# Patient Record
Sex: Male | Born: 1950
Health system: Southern US, Community
[De-identification: ages and names within clinical notes are randomized; demographics above are authoritative.]

## PROBLEM LIST (undated history)

## (undated) DIAGNOSIS — G4733 Obstructive sleep apnea (adult) (pediatric): Secondary | ICD-10-CM

## (undated) DIAGNOSIS — Z77090 Contact with and (suspected) exposure to asbestos: Secondary | ICD-10-CM

## (undated) DIAGNOSIS — G4730 Sleep apnea, unspecified: Secondary | ICD-10-CM

## (undated) DIAGNOSIS — H269 Unspecified cataract: Secondary | ICD-10-CM

## (undated) DIAGNOSIS — T7840XA Allergy, unspecified, initial encounter: Secondary | ICD-10-CM

## (undated) DIAGNOSIS — E785 Hyperlipidemia, unspecified: Secondary | ICD-10-CM

## (undated) DIAGNOSIS — H4010X0 Unspecified open-angle glaucoma, stage unspecified: Secondary | ICD-10-CM

## (undated) DIAGNOSIS — M549 Dorsalgia, unspecified: Secondary | ICD-10-CM

## (undated) DIAGNOSIS — L409 Psoriasis, unspecified: Secondary | ICD-10-CM

## (undated) DIAGNOSIS — E119 Type 2 diabetes mellitus without complications: Secondary | ICD-10-CM

## (undated) DIAGNOSIS — J324 Chronic pansinusitis: Secondary | ICD-10-CM

## (undated) HISTORY — DX: Dorsalgia, unspecified: M54.9

## (undated) HISTORY — DX: Contact with and (suspected) exposure to asbestos: Z77.090

## (undated) HISTORY — DX: Chronic pansinusitis: J32.4

## (undated) HISTORY — PX: FINGER SURGERY: SHX640

## (undated) HISTORY — DX: Obstructive sleep apnea (adult) (pediatric): G47.33

## (undated) HISTORY — DX: Type 2 diabetes mellitus without complications: E11.9

## (undated) HISTORY — DX: Sleep apnea, unspecified: G47.30

## (undated) HISTORY — DX: Allergy, unspecified, initial encounter: T78.40XA

## (undated) HISTORY — DX: Unspecified open-angle glaucoma, stage unspecified: H40.10X0

## (undated) HISTORY — DX: Hyperlipidemia, unspecified: E78.5

## (undated) HISTORY — DX: Unspecified cataract: H26.9

## (undated) HISTORY — DX: Psoriasis, unspecified: L40.9

---

## 1996-09-30 HISTORY — PX: NASAL SINUS SURGERY: SHX719

## 2002-07-14 ENCOUNTER — Encounter: Payer: Self-pay | Admitting: Emergency Medicine

## 2002-07-14 ENCOUNTER — Encounter: Admission: RE | Admit: 2002-07-14 | Discharge: 2002-07-14 | Payer: Self-pay | Admitting: Emergency Medicine

## 2002-07-29 ENCOUNTER — Encounter: Admission: RE | Admit: 2002-07-29 | Discharge: 2002-07-29 | Payer: Self-pay | Admitting: Emergency Medicine

## 2002-07-29 ENCOUNTER — Encounter: Payer: Self-pay | Admitting: Emergency Medicine

## 2003-10-01 HISTORY — PX: COLONOSCOPY: SHX174

## 2004-05-29 ENCOUNTER — Emergency Department (HOSPITAL_COMMUNITY): Admission: EM | Admit: 2004-05-29 | Discharge: 2004-05-30 | Payer: Self-pay | Admitting: Emergency Medicine

## 2006-01-23 ENCOUNTER — Encounter: Admission: RE | Admit: 2006-01-23 | Discharge: 2006-01-23 | Payer: Self-pay | Admitting: Emergency Medicine

## 2007-03-05 ENCOUNTER — Ambulatory Visit (HOSPITAL_COMMUNITY): Admission: RE | Admit: 2007-03-05 | Discharge: 2007-03-05 | Payer: Self-pay | Admitting: Emergency Medicine

## 2007-06-24 ENCOUNTER — Encounter: Admission: RE | Admit: 2007-06-24 | Discharge: 2007-06-24 | Payer: Self-pay | Admitting: Emergency Medicine

## 2008-05-04 ENCOUNTER — Encounter: Admission: RE | Admit: 2008-05-04 | Discharge: 2008-05-04 | Payer: Self-pay | Admitting: Emergency Medicine

## 2008-09-25 IMAGING — CT CT ABDOMEN W/ CM
2 of 5 series · 17 of 46 positions shown, 19 images · IV contrast (READICAT/WATER & [ID] OMNI 300)
Comparison: 01/23/2006

CT ABDOMEN

CLINICAL DATA: Right lower quadrant pain

CT ABDOMEN AND PELVIS WITH CONTRAST
TECHNIQUE: Multidetector CT imaging of the abdomen and pelvis was
performed using the standard protocol following bolus
administration of intravenous contrast.
Contrast: 125 ml [id number]

[Series 2: a&p w/ · axial · 0.78mm/px · z∈[-512,-112]mm · 14 of 92 slices shown, 16 images]
[im 6/92  soft-tissue]
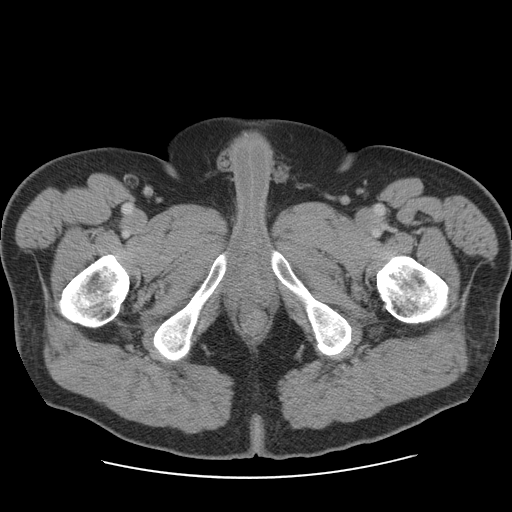
[im 6/92  bone]
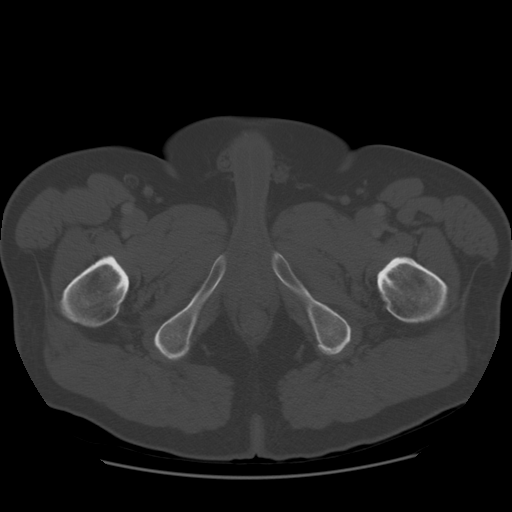
[im 11/92  soft-tissue]
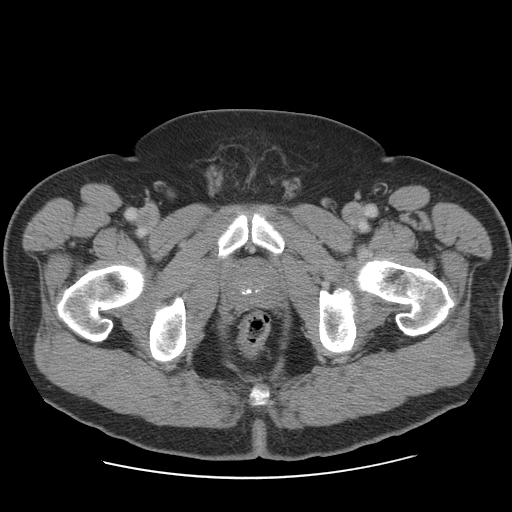
[im 21/92  soft-tissue]
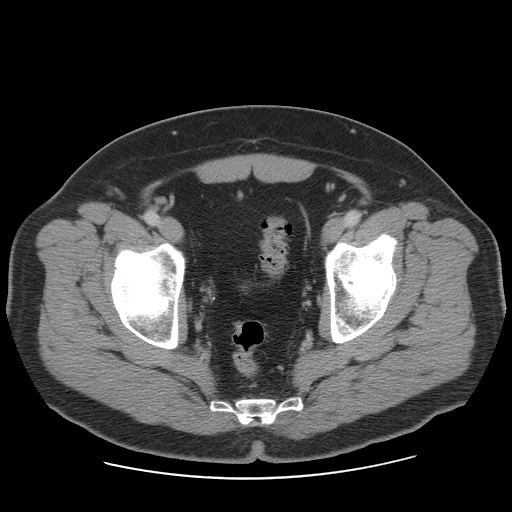
[im 26/92  soft-tissue]
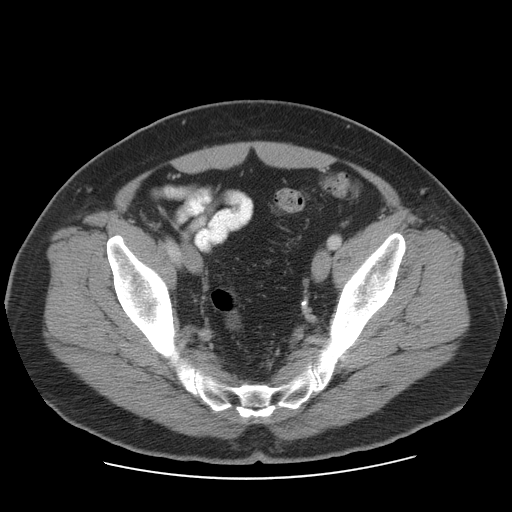
[im 31/92  soft-tissue]
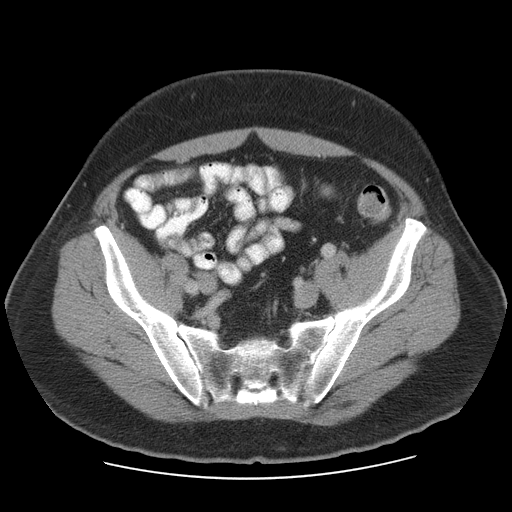
[im 36/92  soft-tissue]
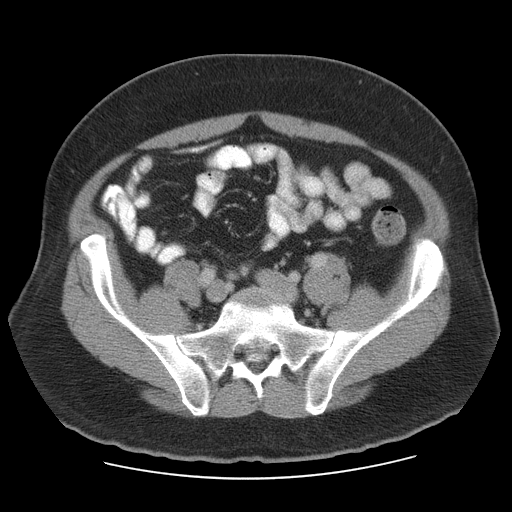
[im 41/92  soft-tissue]
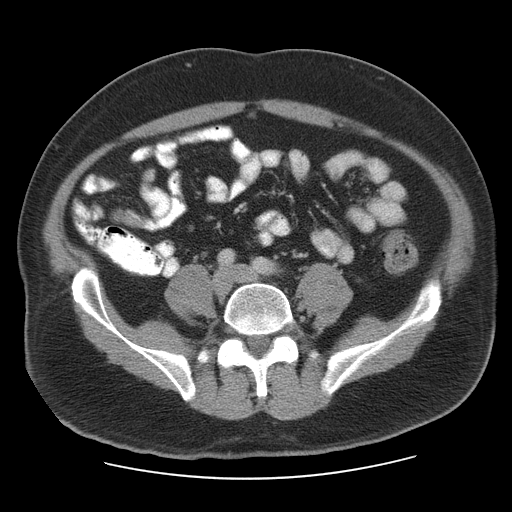
[im 51/92  soft-tissue]
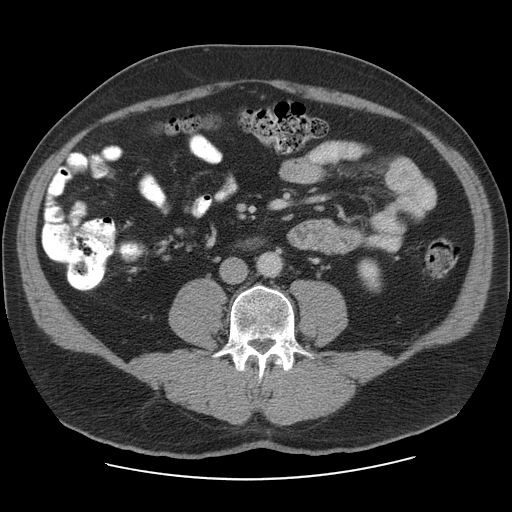
[im 56/92  soft-tissue]
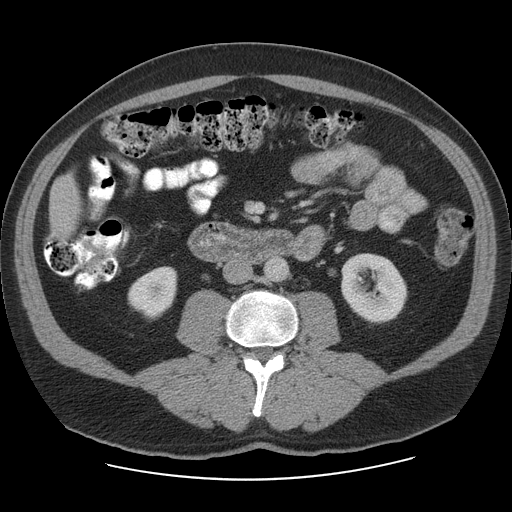
[im 56/92  bone]
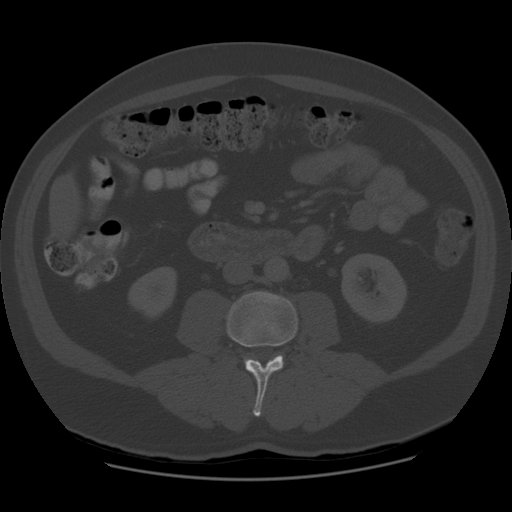
[im 61/92  soft-tissue]
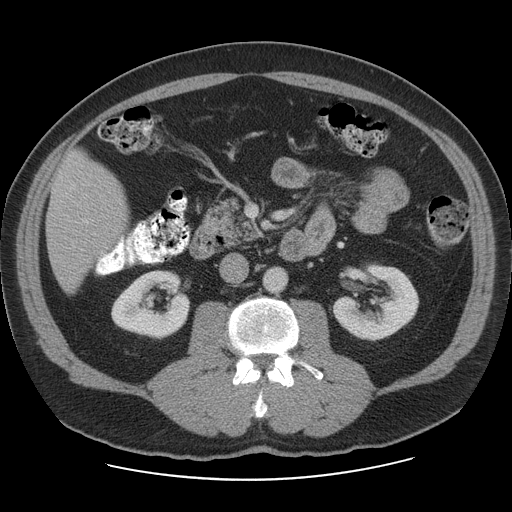
[im 66/92  soft-tissue]
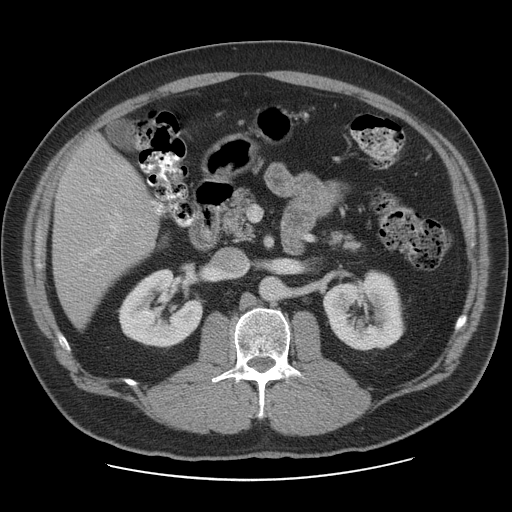
[im 71/92  soft-tissue]
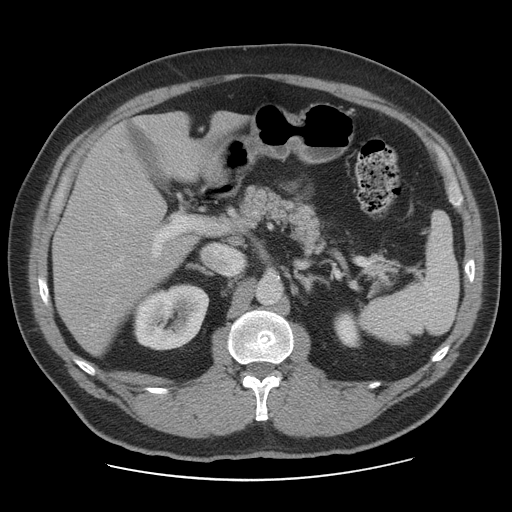
[im 81/92  soft-tissue]
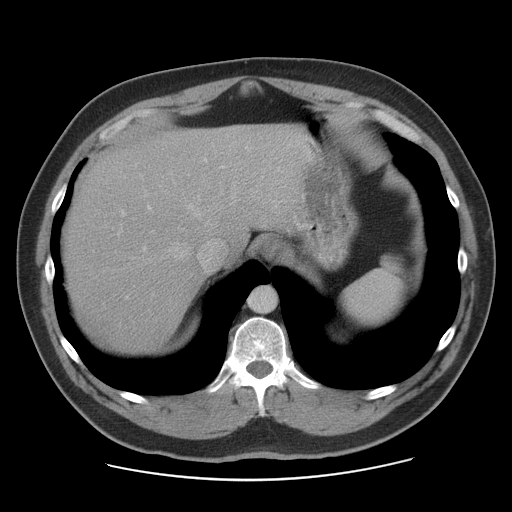
[im 86/92  soft-tissue]
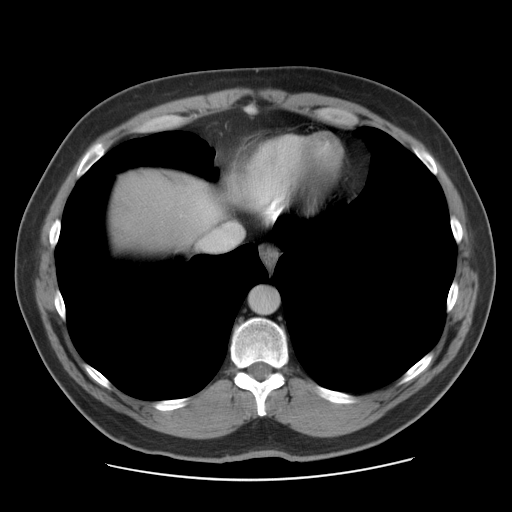

[Series 400: cor abd · coronal · 0.92mm/px · 3 of 152 slices shown]
[im 51/152  soft-tissue]
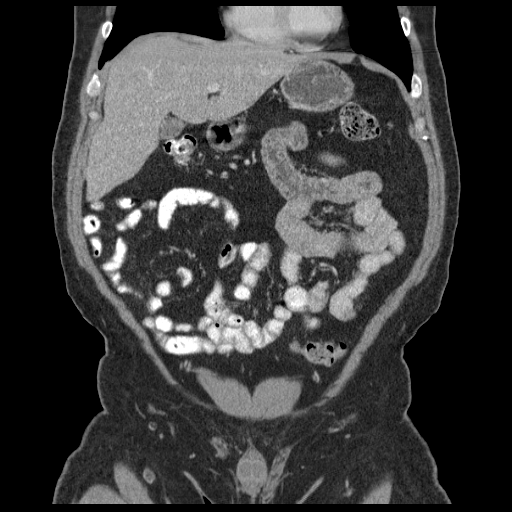
[im 68/152  soft-tissue]
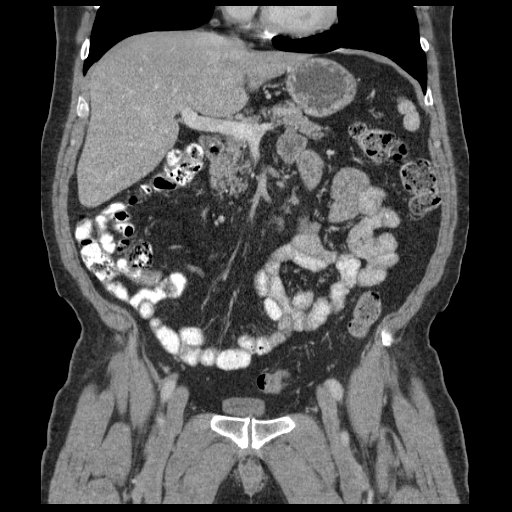
[im 84/152  soft-tissue]
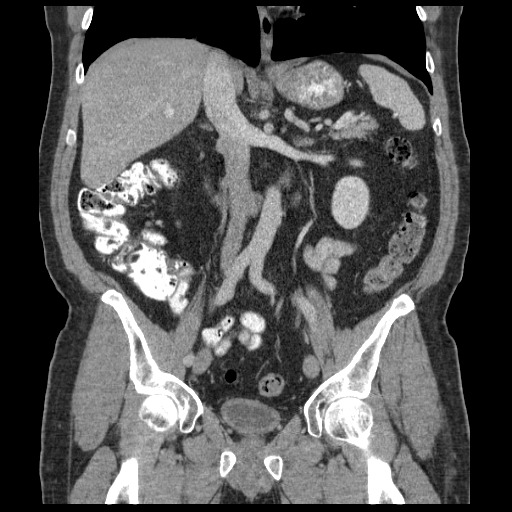

[17 of 46 positions shown; findings below may reference images not displayed]

FINDINGS: Tiny hypodensity in the right liver, adjacent to the
gallbladder fossa is too small to characterize but likely
represents a tiny cyst.  The liver is otherwise unremarkable.  The
spleen, stomach, pancreas, gallbladder, and adrenal glands have
normal imaging features.  Small duodenal diverticulum noted.  No
focal abnormality is seen in either kidney.

No abdominal lymphadenopathy.  No abdominal aortic aneurysm.  There
is no intraperitoneal free fluid.
IMPRESSION: Unremarkable CT scan of the abdomen.

CT PELVIS
FINDINGS: There is no intraperitoneal free fluid.  No pelvic
sidewall lymphadenopathy.  Apparent mild circumferential wall
thickening in the bladder is probably related to underdistension.
Prostate gland is minimally enlarged.  There is no substantial
diverticular change in the colon.  No diverticulitis. Small bowel
loops are normal. The terminal ileum and the appendix are normal.

No evidence for inguinal hernia.  There is no obturator hernia.

Bone windows show no suspicious lytic or sclerotic osseous lesions.
IMPRESSION: Unremarkable CT exam of the pelvis.  Specifically, there is no CT
evidence to explain this patient's history of right lower quadrant
pain.

## 2009-06-21 ENCOUNTER — Encounter: Payer: Self-pay | Admitting: Pulmonary Disease

## 2009-06-21 ENCOUNTER — Ambulatory Visit (HOSPITAL_BASED_OUTPATIENT_CLINIC_OR_DEPARTMENT_OTHER): Admission: RE | Admit: 2009-06-21 | Discharge: 2009-06-21 | Payer: Self-pay | Admitting: Emergency Medicine

## 2009-06-24 ENCOUNTER — Ambulatory Visit: Payer: Self-pay | Admitting: Internal Medicine

## 2009-07-05 ENCOUNTER — Ambulatory Visit: Payer: Self-pay | Admitting: Pulmonary Disease

## 2009-07-05 DIAGNOSIS — J309 Allergic rhinitis, unspecified: Secondary | ICD-10-CM | POA: Insufficient documentation

## 2009-07-05 DIAGNOSIS — E785 Hyperlipidemia, unspecified: Secondary | ICD-10-CM

## 2009-07-05 DIAGNOSIS — G4733 Obstructive sleep apnea (adult) (pediatric): Secondary | ICD-10-CM

## 2009-07-06 ENCOUNTER — Encounter: Payer: Self-pay | Admitting: Pulmonary Disease

## 2009-07-06 ENCOUNTER — Ambulatory Visit (HOSPITAL_BASED_OUTPATIENT_CLINIC_OR_DEPARTMENT_OTHER): Admission: RE | Admit: 2009-07-06 | Discharge: 2009-07-06 | Payer: Self-pay | Admitting: Pulmonary Disease

## 2009-07-18 ENCOUNTER — Ambulatory Visit: Payer: Self-pay | Admitting: Pulmonary Disease

## 2009-08-03 ENCOUNTER — Encounter: Payer: Self-pay | Admitting: Pulmonary Disease

## 2010-05-11 ENCOUNTER — Ambulatory Visit: Payer: Self-pay | Admitting: Pulmonary Disease

## 2010-06-15 ENCOUNTER — Ambulatory Visit: Payer: Self-pay | Admitting: Pulmonary Disease

## 2010-08-16 ENCOUNTER — Encounter: Admission: RE | Admit: 2010-08-16 | Discharge: 2010-08-16 | Payer: Self-pay | Admitting: Family Medicine

## 2010-10-30 NOTE — Assessment & Plan Note (Signed)
Summary: rov for osa   Copy to:  [facility] Korri Ask/Referring Elajah Kunsman:  [facility]  CC:  5 week follow up. Pt states he uses cpap 7/7 nights x 6-8 hrs a night. Pt states his mask will occasionally have air seep through..  History of Present Illness: the pt comes in today for f/u of his known osa.  He has been started on cpap at optimal pressure, and feels he is doing very well.  He has seen significant improvement in the quality of his sleep, is not snoring, and has improved alertness during the day.  He is having no issues with pressure or mask, but occasionally will have air leak if he turns on his side.  Overall, he is very pleased with results.  Current Medications (verified): 1)  Aspirin Ec Low Dose 81 Mg Tbec (Aspirin) .... Take 1 Tablet By Mouth Once A Day 2)  Levocetirizine Dihydrochloride 5 Mg Tabs (Levocetirizine Dihydrochloride) .... Once Daily 3)  Alprazolam 0.25 Mg Tabs (Alprazolam) .... 1/2 Tab As Needed For Anxiety 4)  Hydrochlorothiazide 25 Mg Tabs (Hydrochlorothiazide) .... Once Daily 5)  Pravastatin Sodium (? Strength) .... Once Daily  Allergies (verified): No Known Drug Allergies  Review of Systems       The patient complains of nasal congestion/difficulty breathing through nose and anxiety.  The patient denies shortness of breath with activity, shortness of breath at rest, productive cough, non-productive cough, coughing up blood, chest pain, irregular heartbeats, acid heartburn, indigestion, loss of appetite, weight change, abdominal pain, difficulty swallowing, sore throat, tooth/dental problems, headaches, sneezing, itching, ear ache, depression, hand/feet swelling, rash, change in color of mucus, and fever.    Vital Signs:  Patient profile:   60 year old male Height:      76 inches Weight:      263 pounds BMI:     32.13 O2 Sat:      98 % on Room air Temp:     98.0 degrees F oral Pulse rate:   68 / minute BP sitting:   130 / 90  (left arm) Cuff  size:   large  Vitals Entered By: Carver Fila (June 15, 2010 9:31 AM)  O2 Flow:  Room air CC: 5 week follow up. Pt states he uses cpap 7/7 nights x 6-8 hrs a night. Pt states his mask will occasionally have air seep through. Comments meds and allergies updated Phone number updated Carver Fila  June 15, 2010 9:32 AM    Physical Exam  General:  ow male in nad Nose:  no skin breakdown or pressure necrosis from cpap mask Extremities:  no edema or cyanosis  Neurologic:  alert and oriented,moves all 4. does not appear sleepy   Impression & Recommendations:  Problem # 1:  OBSTRUCTIVE SLEEP APNEA (ICD-327.23)  the pt is doing well with cpap on optimal pressure.  He has no tolerance issues, but occasionally will have small air leak on mask.  This is not a big issue for him.  I have asked him to stay on cpap, and to work hard on weight loss.  I have reminded him that he can probably come off cpap if he is able to get back to his ideal body weight.  Will see him again in 6mos.  Medications Added to Medication List This Visit: 1)  Levocetirizine Dihydrochloride 5 Mg Tabs (Levocetirizine dihydrochloride) .... Once daily 2)  Alprazolam 0.25 Mg Tabs (Alprazolam) .... 1/2 tab as needed for anxiety 3)  Hydrochlorothiazide 25 Mg  Tabs (Hydrochlorothiazide) .... Once daily 4)  Pravastatin Sodium (? Strength)  .... Once daily  Other Orders: Est. Patient Level III (81191)  Patient Instructions: 1)  continue on cpap , work on weight loss 2)  followup with me in 6mos.   Immunization History:  Influenza Immunization History:    Influenza:  historical (06/30/2009)

## 2010-10-30 NOTE — Assessment & Plan Note (Signed)
Summary: rov for management of osa   Copy to:  [facility] Provider/Referring Provider:  [facility]  CC:  Pt is here for a f/u appt to discuss sleep study results. .  History of Present Illness: The pt is a 60y/o male who I have seen in the past for severe osa.  He had a sleep study last year which documented severe disease, however had issues with cpap titration due to pressure induced centrals.  He underwent a formal cpap titration, and felt to have the best control with a cpap pressure of 9cm.  Attempts were made to contact the pt for a f/u apptm, and a certified letter was sent to his home as well.  He has been lost to f/u, but returns today to discuss starting cpap.  He continues to have nonrestorative sleep,and EDS.  Medications Prior to Update: 1)  None  Allergies (verified): No Known Drug Allergies  Past History:  Past medical, surgical, family and social histories (including risk factors) reviewed, and no changes noted (except as noted below).  Past Medical History: Reviewed history from 07/05/2009 and no changes required. OSA HYPERLIPIDEMIA (ICD-272.4) ALLERGIC RHINITIS (ICD-477.9)    Past Surgical History: Reviewed history from 07/05/2009 and no changes required. sinus surery 1998  Family History: Reviewed history from 07/05/2009 and no changes required. allergies: father, sisters, brothers cancer: maternal uncle (colon) paternal uncles x 3 (prostate)   Social History: Reviewed history from 07/05/2009 and no changes required. pt is single. pt has children. pt will occ. smoke cigars. pt works as a Physiological scientist.    Review of Systems       The patient complains of shortness of breath with activity, productive cough, acid heartburn, indigestion, headaches, and nasal congestion/difficulty breathing through nose.  The patient denies shortness of breath at rest, non-productive cough, coughing up blood, chest pain, irregular heartbeats, loss of  appetite, weight change, abdominal pain, difficulty swallowing, sore throat, tooth/dental problems, sneezing, itching, ear ache, anxiety, depression, hand/feet swelling, joint stiffness or pain, rash, change in color of mucus, and fever.    Vital Signs:  Patient profile:   60 year old male Height:      76 inches Weight:      261.50 pounds BMI:     31.95 O2 Sat:      97 % on Room air Temp:     98.6 degrees F oral Pulse rate:   83 / minute BP sitting:   132 / 74  (left arm) Cuff size:   large  Vitals Entered By: Arman Filter LPN (May 11, 2010 2:50 PM)  O2 Flow:  Room air CC: Pt is here for a f/u appt to discuss sleep study results.  Comments Unable to review meds with pt as he is unsure what meds he is currently taking and does not have a current med list with him.  Aundra Millet Reynolds LPN  May 11, 2010 2:50 PM    Physical Exam  General:  ow male in nad Nose:  no obvious drainage or purulence Extremities:  no edema noted, no cyanosis  Neurologic:  alert and oriented, moves all 4.   Impression & Recommendations:  Problem # 1:  OBSTRUCTIVE SLEEP APNEA (ICD-327.23) the pt has a h/o severe osa that continues to be untreated.  Given the severity, his best treatment options are cpap while working on weight loss.  He is willing to give this a try, and will go ahead and start on 9cm.  He has  gained further weight since the last titration, and Schreck[place]ost need higher pressures.  If he has suboptimal response to the initial pressure setting, can try him on auto to optimize further.  He is concerned about possible issues with claustrophobia, and I have discussed various strategies with him regarding desensitization.  Other Orders: Est. Patient Level IV (16109) DME Referral (DME)  Patient Instructions: 1)  will start on cpap with full face mask initially.  Please call if having issues with tolerance 2)  can use ramp button to help with pressure tolerance, and can also wear for at a time  during waking hours to help with desensitization. 3)  work on weight loss 4)  followup with me in 5 weeks.  Appended Document: rov for management of osa received med list from [facility] and added these meds to pt's current med list.  Arman Filter LPN  May 14, 2010 4:37 PM    Clinical Lists Changes  Medications: Added new medication of VITAMIN B-12 1000 MCG TABS (CYANOCOBALAMIN) injection once a month Added new medication of HYDROXYZINE HCL 25 MG TABS (HYDROXYZINE HCL) Take 1 tablet by mouth once a day Added new medication of KLOR-CON 10 10 MEQ CR-TABS (POTASSIUM CHLORIDE) Take 1 tablet by mouth once a day Added new medication of LIPITOR 10 MG TABS (ATORVASTATIN CALCIUM) Take 1 tablet by mouth once a day Added new medication of LISINOPRIL 20 MG TABS (LISINOPRIL) Take 1 tablet by mouth once a day Added new medication of MECLIZINE HCL 25 MG TABS (MECLIZINE HCL) take by mouth as needed for dizziness Added new medication of METOPROLOL TARTRATE 50 MG TABS (METOPROLOL TARTRATE) Take 1 tablet by mouth two times a day Added new medication of MOBIC 15 MG TABS (MELOXICAM) Take 1 tablet by mouth once a day Added new medication of PLAVIX 75 MG TABS (CLOPIDOGREL BISULFATE) Take 1 tablet by mouth once a day Added new medication of PROTONIX 40 MG TBEC (PANTOPRAZOLE SODIUM) Take 1 tablet by mouth once a day Added new medication of ASPIRIN EC LOW DOSE 81 MG TBEC (ASPIRIN) Take 1 tablet by mouth once a day

## 2010-12-12 ENCOUNTER — Ambulatory Visit: Payer: Self-pay | Admitting: Pulmonary Disease

## 2010-12-13 ENCOUNTER — Telehealth: Payer: Self-pay | Admitting: Pulmonary Disease

## 2010-12-18 NOTE — Progress Notes (Signed)
Summary: nos appt  Phone Note Call from Patient   Caller: [email or web address]  Call For: Ashleigh Arya Summary of Call: Rsc nos from 3/14 to 3/28. Initial call taken by: Darletta Moll,  December 13, 2010 9:25 AM

## 2010-12-25 ENCOUNTER — Encounter: Payer: Self-pay | Admitting: Pulmonary Disease

## 2010-12-26 ENCOUNTER — Encounter: Payer: Self-pay | Admitting: Pulmonary Disease

## 2010-12-26 ENCOUNTER — Ambulatory Visit (INDEPENDENT_AMBULATORY_CARE_PROVIDER_SITE_OTHER): Payer: 59 | Admitting: Pulmonary Disease

## 2010-12-26 VITALS — BP 132/88 | HR 59 | Temp 98.2°F | Ht 76.0 in | Wt 259.4 lb

## 2010-12-26 DIAGNOSIS — G4733 Obstructive sleep apnea (adult) (pediatric): Secondary | ICD-10-CM

## 2010-12-26 NOTE — Patient Instructions (Signed)
Continue on cpap, keep up with mask changes and supplies. Work on weight loss followup with me in one year .  

## 2010-12-26 NOTE — Progress Notes (Signed)
  Subjective:    Patient ID: Gary Buckley, male    DOB: 1951-04-21, 60 y.o.   MRN: [id number]  HPI The pt comes in today for f/u of his known osa.  He is wearing cpap compliantly, and reports no issues with machine or mask.  He is sleeping well, and is satisfied with his daytime alertness.  He has lost 4 pounds since the last visit.    Review of Systems  Constitutional: Negative for fever, appetite change and unexpected weight change.  HENT: Negative for ear pain, congestion, sore throat, rhinorrhea, sneezing, trouble swallowing, dental problem and postnasal drip.   Eyes: Negative for redness.  Respiratory: Negative for cough, chest tightness, shortness of breath and wheezing.   Cardiovascular: Negative for chest pain, palpitations and leg swelling.  Gastrointestinal: Negative for nausea, abdominal pain and diarrhea.  Genitourinary: Negative for dysuria.  Musculoskeletal: Negative for joint swelling.  Skin: Negative for rash.  Neurological: Negative for syncope and headaches.  Hematological: Does not bruise/bleed easily.  Psychiatric/Behavioral: Negative for dysphoric mood. The patient is not nervous/anxious.        Objective:   Physical Exam Ow male in nad No skin breakdown or pressure necrosis from cpap mask  No LE edema Alert, oriented, moves all 4        Assessment & Plan:

## 2010-12-26 NOTE — Assessment & Plan Note (Signed)
The pt is doing well with cpap, and reports no mask or pressure issues.  He is sleeping better, with improved daytime alertness.  He has lost a small amt of weight since last visit, but I have encouraged him to continue doing so.

## 2011-07-17 ENCOUNTER — Ambulatory Visit
Admission: RE | Admit: 2011-07-17 | Discharge: 2011-07-17 | Disposition: A | Discharge status: Home / Self Care | Payer: 59 | Source: Ambulatory Visit | Attending: Family Medicine | Admitting: Family Medicine

## 2011-07-17 ENCOUNTER — Other Ambulatory Visit: Payer: Self-pay | Admitting: Family Medicine

## 2011-07-17 DIAGNOSIS — M549 Dorsalgia, unspecified: Secondary | ICD-10-CM

## 2011-07-17 DIAGNOSIS — M542 Cervicalgia: Secondary | ICD-10-CM

## 2011-10-21 ENCOUNTER — Other Ambulatory Visit: Payer: Self-pay | Admitting: Family Medicine

## 2011-10-21 DIAGNOSIS — R109 Unspecified abdominal pain: Secondary | ICD-10-CM

## 2011-10-23 ENCOUNTER — Other Ambulatory Visit: Payer: 59

## 2011-10-25 ENCOUNTER — Ambulatory Visit
Admission: RE | Admit: 2011-10-25 | Discharge: 2011-10-25 | Disposition: A | Discharge status: Home / Self Care | Payer: 59 | Source: Ambulatory Visit | Attending: Family Medicine | Admitting: Family Medicine

## 2011-10-25 DIAGNOSIS — R109 Unspecified abdominal pain: Secondary | ICD-10-CM

## 2012-10-21 ENCOUNTER — Other Ambulatory Visit (HOSPITAL_COMMUNITY): Payer: Self-pay | Admitting: Family Medicine

## 2012-10-21 DIAGNOSIS — R0602 Shortness of breath: Secondary | ICD-10-CM

## 2012-10-23 ENCOUNTER — Ambulatory Visit (HOSPITAL_COMMUNITY)
Admission: RE | Admit: 2012-10-23 | Discharge: 2012-10-23 | Disposition: A | Discharge status: Home / Self Care | Payer: 59 | Source: Ambulatory Visit | Attending: Family Medicine | Admitting: Family Medicine

## 2012-10-23 DIAGNOSIS — R0602 Shortness of breath: Secondary | ICD-10-CM | POA: Insufficient documentation

## 2012-10-23 MED ORDER — ALBUTEROL SULFATE (5 MG/ML) 0.5% IN NEBU
2.5000 mg | INHALATION_SOLUTION | Freq: Once | RESPIRATORY_TRACT | Status: AC
  Administered 2012-10-23: 2.5 mg via RESPIRATORY_TRACT

## 2012-10-27 ENCOUNTER — Encounter (HOSPITAL_COMMUNITY): Payer: 59

## 2014-03-28 ENCOUNTER — Encounter: Payer: Self-pay | Admitting: Internal Medicine

## 2014-06-07 ENCOUNTER — Ambulatory Visit (AMBULATORY_SURGERY_CENTER): Payer: Self-pay | Admitting: *Deleted

## 2014-06-07 VITALS — Ht 76.0 in | Wt 253.4 lb

## 2014-06-07 DIAGNOSIS — Z1211 Encounter for screening for malignant neoplasm of colon: Secondary | ICD-10-CM

## 2014-06-07 MED ORDER — NA SULFATE-K SULFATE-MG SULF 17.5-3.13-1.6 GM/177ML PO SOLN: 1.0000 | Freq: Once | ORAL | Status: DC

## 2014-06-07 NOTE — Progress Notes (Signed)
No egg or soy allergy. ewm No home 02 use but uses cpap every night. ewm No problems with past sedation. ewm

## 2014-06-21 ENCOUNTER — Ambulatory Visit (AMBULATORY_SURGERY_CENTER): Payer: 59 | Admitting: Internal Medicine

## 2014-06-21 ENCOUNTER — Encounter: Payer: Self-pay | Admitting: Internal Medicine

## 2014-06-21 VITALS — BP 141/100 | HR 48 | Temp 97.1°F | Resp 27 | Ht 76.0 in | Wt 253.0 lb

## 2014-06-21 DIAGNOSIS — K5730 Diverticulosis of large intestine without perforation or abscess without bleeding: Secondary | ICD-10-CM

## 2014-06-21 DIAGNOSIS — K648 Other hemorrhoids: Secondary | ICD-10-CM

## 2014-06-21 DIAGNOSIS — K644 Residual hemorrhoidal skin tags: Secondary | ICD-10-CM

## 2014-06-21 DIAGNOSIS — Z1211 Encounter for screening for malignant neoplasm of colon: Secondary | ICD-10-CM

## 2014-06-21 MED ORDER — SODIUM CHLORIDE 0.9 % IV SOLN: 500.0000 mL | INTRAVENOUS | Status: DC

## 2014-06-21 NOTE — Progress Notes (Signed)
Report to PACU, RN, vss, BBS= Clear.  

## 2014-06-21 NOTE — Op Note (Signed)
[place]  [address] [place], [address]   COLONOSCOPY PROCEDURE REPORT  PATIENT: Buckley, Gary B  MR#: [id number] BIRTHDATE: Oct 15, 1950 , 62  yrs. old GENDER: male ENDOSCOPIST: Gatha Mayer, MD, [facility] PROCEDURE DATE:  06/21/2014 PROCEDURE:   Colonoscopy, screening First Screening Colonoscopy - Avg.  risk and is 50 yrs.  old or older - No.  Prior Negative Screening - Now for repeat screening. 10 or more years since last screening  History of Adenoma - Now for follow-up colonoscopy & has been > or = to 3 yrs.  N/A  Polyps Removed Today? No.  Recommend repeat exam, <10 yrs? Polyps Removed Today? No.  Recommend repeat exam, <10 yrs? No. ASA CLASS:   Class II INDICATIONS:average risk for colorectal cancer. MEDICATIONS: Monitored anesthesia care and Propofol 200 mg  DESCRIPTION OF PROCEDURE:   After the risks benefits and alternatives of the procedure were thoroughly explained, informed consent was obtained. Digital exam  revealed no abnormalities of the rectum, revealed the prostate was not enlarged, and revealed no prostatic nodules.   The LB [id number]  endoscope was introduced through the anus and advanced to the cecum, which was identified by both the appendix and ileocecal valve. No adverse events experienced.   The quality of the prep was excellent using Suprep  The instrument was then slowly withdrawn as the colon was fully examined.  COLON FINDINGS: There was mild diverticulosis noted in the sigmoid colon.   The colon mucosa was otherwise normal.  No other polyps, cancers or masses were seen.  Retroflexed views revealed internal Grade I hemorrhoids and Retroflexed views revealed external hemorrhoids. The time to cecum=1 minutes 21 seconds.  Withdrawal time=6 minutes 38 seconds.  The scope was withdrawn and the procedure completed. COMPLICATIONS: There were no complications.  ENDOSCOPIC IMPRESSION: 1.   Mild diverticulosis was noted in the  sigmoid colon 2.   The colon mucosa was otherwise normal - excellent prep  RECOMMENDATIONS: Repeat colonoscopy 10 years 2025  eSigned:  Gatha Mayer, MD, [facility] 06/21/2014 11:26 AM   cc: The Patient and Mayra Neer, MD

## 2014-06-21 NOTE — Progress Notes (Signed)
No problems noted in the recovery room. maw 

## 2014-06-21 NOTE — Patient Instructions (Addendum)
No polyps today! You do have a condition called diverticulosis - common and not usually a problem. Please read the handout provided. I also saw hemorrhoids.  If you have hemorrhoid problems (swelling, itching, bleeding) I am able to treat those with an in-office procedure. If you like, please call my office at [phone] to schedule an appointment and I can evaluate you further.  Next routine colonoscopy in 10 years - 2025  I appreciate the opportunity to care for you. Gatha Mayer, MD, FACG     YOU HAD AN ENDOSCOPIC PROCEDURE TODAY AT [facility]: Refer to the procedure report that was given to you for any specific questions about what was found during the examination.  If the procedure report does not answer your questions, please call your gastroenterologist to clarify.  If you requested that your care partner not be given the details of your procedure findings, then the procedure report has been included in a sealed envelope for you to review at your convenience later.  YOU SHOULD EXPECT: Some feelings of bloating in the abdomen. Passage of more gas than usual.  Walking can help get rid of the air that was put into your GI tract during the procedure and reduce the bloating. If you had a lower endoscopy (such as a colonoscopy or flexible sigmoidoscopy) you Merle notice spotting of blood in your stool or on the toilet paper. If you underwent a bowel prep for your procedure, then you Buechele not have a normal bowel movement for a few days.  DIET: Your first meal following the procedure should be a light meal and then it is ok to progress to your normal diet.  A half-sandwich or bowl of soup is an example of a good first meal.  Heavy or fried foods are harder to digest and Doan make you feel nauseous or bloated.  Likewise meals heavy in dairy and vegetables can cause extra gas to form and this can also increase the bloating.  Drink plenty of fluids but you should avoid alcoholic  beverages for 24 hours.  ACTIVITY: Your care partner should take you home directly after the procedure.  You should plan to take it easy, moving slowly for the rest of the day.  You can resume normal activity the day after the procedure however you should NOT DRIVE or use heavy machinery for 24 hours (because of the sedation medicines used during the test).    SYMPTOMS TO REPORT IMMEDIATELY: A gastroenterologist can be reached at any hour.  During normal business hours, 8:30 AM to 5:00 PM Monday through Friday, call [phone].  After hours and on weekends, please call the GI answering service at [phone] who will take a message and have the physician on call contact you.   Following lower endoscopy (colonoscopy or flexible sigmoidoscopy):  Excessive amounts of blood in the stool  Significant tenderness or worsening of abdominal pains  Swelling of the abdomen that is new, acute  Fever of 100F or higher FOLLOW UP: If any biopsies were taken you will be contacted by phone or by letter within the next 1-3 weeks.  Call your gastroenterologist if you have not heard about the biopsies in 3 weeks.  Our staff will call the home number listed on your records the next business day following your procedure to check on you and address any questions or concerns that you [place] have at that time regarding the information given to you following your procedure. This is a  courtesy call and so if there is no answer at the home number and we have not heard from you through the emergency physician on call, we will assume that you have returned to your regular daily activities without incident.  SIGNATURES/CONFIDENTIALITY: You and/or your care partner have signed paperwork which will be entered into your electronic medical record.  These signatures attest to the fact that that the information above on your After Visit Summary has been reviewed and is understood.  Full responsibility of the confidentiality of  this discharge information lies with you and/or your care-partner.     Handouts were given to your care partner on diverticulosis and  hemorrhoids You Rase resume your current medications today. Please call if any questions or concerns.

## 2014-06-22 ENCOUNTER — Telehealth: Payer: Self-pay | Admitting: *Deleted

## 2014-06-22 NOTE — Telephone Encounter (Signed)
  Follow up Call-  Call back number 06/21/2014  Post procedure Call Back phone  # [phone]  Permission to leave phone message Yes     Patient questions:  Do you have a fever, pain , or abdominal swelling? No. Pain Score  0 *  Have you tolerated food without any problems? Yes.    Have you been able to return to your normal activities? Yes.    Do you have any questions about your discharge instructions: Diet   No. Medications  No. Follow up visit  No.  Do you have questions or concerns about your Care? No.  Actions: * If pain score is 4 or above: No action needed, pain <4.

## 2014-10-04 ENCOUNTER — Ambulatory Visit
Admission: RE | Admit: 2014-10-04 | Discharge: 2014-10-04 | Disposition: A | Discharge status: Home / Self Care | Payer: 59 | Source: Ambulatory Visit | Attending: Physician Assistant | Admitting: Physician Assistant

## 2014-10-04 ENCOUNTER — Other Ambulatory Visit: Payer: Self-pay | Admitting: Physician Assistant

## 2014-10-04 DIAGNOSIS — M545 Low back pain: Secondary | ICD-10-CM

## 2015-01-04 ENCOUNTER — Ambulatory Visit (INDEPENDENT_AMBULATORY_CARE_PROVIDER_SITE_OTHER): Payer: 59 | Admitting: Pulmonary Disease

## 2015-01-04 ENCOUNTER — Encounter: Payer: Self-pay | Admitting: Pulmonary Disease

## 2015-01-04 VITALS — BP 136/78 | HR 52 | Temp 97.7°F | Ht 76.0 in | Wt 257.8 lb

## 2015-01-04 DIAGNOSIS — G4733 Obstructive sleep apnea (adult) (pediatric): Secondary | ICD-10-CM

## 2015-01-04 NOTE — Progress Notes (Signed)
   Subjective:    Patient ID: Gary Buckley, male    DOB: August 29, 1951, 64 y.o.   MRN: [id number]  HPI The patient comes in today for follow-up of his obstructive sleep apnea. He is wearing C Pap compliantly, and has done well with his device. He is having issues with his machine function, and his current device is over 39 years old. He is obviously needing a replacement.   Review of Systems  Constitutional: Negative for fever and unexpected weight change.  HENT: Negative for congestion, dental problem, ear pain, nosebleeds, postnasal drip, rhinorrhea, sinus pressure, sneezing, sore throat and trouble swallowing.   Eyes: Negative for redness and itching.  Respiratory: Negative for cough, chest tightness, shortness of breath and wheezing.   Cardiovascular: Negative for palpitations and leg swelling.  Gastrointestinal: Negative for nausea and vomiting.  Genitourinary: Negative for dysuria.  Musculoskeletal: Negative for joint swelling.  Skin: Negative for rash.  Neurological: Negative for headaches.  Hematological: Does not bruise/bleed easily.  Psychiatric/Behavioral: Negative for dysphoric mood. The patient is not nervous/anxious.        Objective:   Physical Exam Overweight male in no acute distress Nose without purulence or discharge noted Neck without lymphadenopathy or thyromegaly No skin breakdown or pressure necrosis from the C Pap mask Lower extremities without edema, no cyanosis Alert and oriented, moves all 4 extremities.       Assessment & Plan:

## 2015-01-04 NOTE — Assessment & Plan Note (Signed)
The patient has done extremely well with C Pap, and feels that it has greatly improved his sleep and daytime alertness. He is currently having issues with his machine working properly, and because it is 66 years old, and needs to be replaced. I will order a new device for him, and have it set on the automatic setting. I have also encouraged him to work aggressively on weight loss.

## 2015-01-04 NOTE — Patient Instructions (Signed)
Will see if we can get you a new machine. Work on weight loss followup with me yearly.

## 2016-01-29 ENCOUNTER — Ambulatory Visit (INDEPENDENT_AMBULATORY_CARE_PROVIDER_SITE_OTHER): Payer: 59 | Admitting: Pulmonary Disease

## 2016-01-29 ENCOUNTER — Encounter: Payer: Self-pay | Admitting: Pulmonary Disease

## 2016-01-29 VITALS — BP 126/82 | HR 58 | Wt 258.4 lb

## 2016-01-29 DIAGNOSIS — G4733 Obstructive sleep apnea (adult) (pediatric): Secondary | ICD-10-CM

## 2016-01-29 DIAGNOSIS — Z9989 Dependence on other enabling machines and devices: Principal | ICD-10-CM

## 2016-01-29 NOTE — Progress Notes (Signed)
Current Outpatient Prescriptions on File Prior to Visit  Medication Sig  . ALPRAZolam (XANAX) 0.25 MG tablet 1/2 tablet as needed for anxiety   . aspirin 81 MG tablet Take 81 mg by mouth daily.    Marland Kitchen HYDROcodone-acetaminophen (NORCO/VICODIN) 5-325 MG per tablet Take 1 tablet by mouth at bedtime and [place] repeat dose one time if needed.   Marland Kitchen levocetirizine (XYZAL) 5 MG tablet Take 5 mg by mouth every evening.    . magnesium 30 MG tablet Take 60 mg by mouth 2 (two) times daily.  . montelukast (SINGULAIR) 10 MG tablet Take 10 mg by mouth at bedtime.  . Omega-3 Fatty Acids (FISH OIL BURP-LESS PO) Take 2 capsules by mouth 2 (two) times daily.    No current facility-administered medications on file prior to visit.     Chief Complaint  Patient presents with  . Follow-up    Former [place] pt: Wears CPAP nightly. Needs new CPAP machine - travels a lot and would like a machine to use when traveling. Denies problems with mask/pressure. Sleeping through the night. Feels some rested in AM, not as energetic in AM as he used to be. DME: [facility]     Tests PSG 06/21/09 >> AHI 75  Past medical hx HLD, Allergies, Back pain  Past surgical hx, Allergies, Family hx, Social hx all reviewed.  Vital Signs BP 126/82 mmHg  Pulse 58  Wt 258 lb 6.4 oz (117.209 kg)  SpO2 96%  History of Present Illness Gary Buckley is a 65 y.o. male with obstructive sleep apnea.  He uses CPAP every night.  He doesn't have any issue with his mask fit.  He tried getting a new machine last year through his insurance, since his current machine is more than 67 yrs old.  He was told that policy no longer applies, and his machine would have to be broken and not be repairable in order to be eligible for a new machine.  He travels a lot, and would like a second machine.  He is willing to purchase a CPAP machine independent of his insurance.  Physical Exam  General - No distress ENT - No sinus tenderness, no oral exudate, no LAN, MP 4,  scalloped tongue, elongated uvula Cardiac - s1s2 regular, no murmur Chest - No wheeze/rales/dullness Back - No focal tenderness Abd - Soft, non-tender Ext - No edema Neuro - Normal strength Skin - No rashes Psych - normal mood, and behavior   Assessment/Plan  Obstructive sleep apnea. - will get copy of his CPAP download - will then write script for a second CPAP machine for him >> advised him to look online for different CPAP options; if he does not find one in particular he likes, then will send order for Resmed Airsense 10   Patient Instructions  Will call with results of CPAP report, and then get your prescription for a new CPAP machine  Follow up in 1 year    Chesley Mires, MD [facility] Pager:  [phone] 01/29/2016, 10:14 AM

## 2016-01-29 NOTE — Patient Instructions (Signed)
Will call with results of CPAP report, and then get your prescription for a new CPAP machine  Follow up in 1 year

## 2016-02-19 ENCOUNTER — Telehealth: Payer: Self-pay | Admitting: Pulmonary Disease

## 2016-02-19 DIAGNOSIS — G4733 Obstructive sleep apnea (adult) (pediatric): Secondary | ICD-10-CM

## 2016-02-19 NOTE — Telephone Encounter (Signed)
Spoke with pt. Advised him that VS wanted a download from his current machine. Pt states that he turned his SD card into [facility] 2 weeks ago. We do not have the download. lmtcb x1 with [facility] from [facility].

## 2016-02-20 NOTE — Telephone Encounter (Signed)
Gary Buckley returned our call - She states that she faxed download that was requested this morning -prm

## 2016-02-20 NOTE — Telephone Encounter (Signed)
CPAP 01/04/16 to 02/02/16 >> used on 29 of 30 nights with average 6 hrs and 55 min.  Average AHI is 1.2 with median CPAP 9 cm H2O.   Will have my nurse inform pt that CPAP report looks very good.  No change to current set up.

## 2016-02-20 NOTE — Telephone Encounter (Signed)
lmtcb x1 with Melissa at [facility]. 

## 2016-02-20 NOTE — Telephone Encounter (Signed)
Will send to Dr Halford Chessman to address. Thanks.

## 2016-02-20 NOTE — Telephone Encounter (Signed)
Received fax, placed in Dr. Juanetta Gosling box. Will send to [facility] for follow up.

## 2016-02-21 NOTE — Telephone Encounter (Signed)
lmtcb X1 for pt  

## 2016-02-21 NOTE — Telephone Encounter (Signed)
Spoke with pt and reviewed DL and VS's notes. Pt would now like to order new CPAP machine. Ok per VS's LOV note. Order placed. Nothing further needed.

## 2016-02-29 ENCOUNTER — Encounter (HOSPITAL_COMMUNITY): Payer: Self-pay | Admitting: *Deleted

## 2016-02-29 ENCOUNTER — Emergency Department (HOSPITAL_COMMUNITY)
Admission: EM | Admit: 2016-02-29 | Discharge: 2016-02-29 | Disposition: A | Discharge status: Home / Self Care | Payer: 59 | Attending: Emergency Medicine | Admitting: Emergency Medicine

## 2016-02-29 DIAGNOSIS — S40861A Insect bite (nonvenomous) of right upper arm, initial encounter: Secondary | ICD-10-CM | POA: Diagnosis not present

## 2016-02-29 DIAGNOSIS — Y939 Activity, unspecified: Secondary | ICD-10-CM | POA: Insufficient documentation

## 2016-02-29 DIAGNOSIS — Z87891 Personal history of nicotine dependence: Secondary | ICD-10-CM | POA: Insufficient documentation

## 2016-02-29 DIAGNOSIS — Y929 Unspecified place or not applicable: Secondary | ICD-10-CM | POA: Diagnosis not present

## 2016-02-29 DIAGNOSIS — R61 Generalized hyperhidrosis: Secondary | ICD-10-CM | POA: Insufficient documentation

## 2016-02-29 DIAGNOSIS — H81399 Other peripheral vertigo, unspecified ear: Secondary | ICD-10-CM | POA: Insufficient documentation

## 2016-02-29 DIAGNOSIS — Y999 Unspecified external cause status: Secondary | ICD-10-CM | POA: Diagnosis not present

## 2016-02-29 DIAGNOSIS — W57XXXA Bitten or stung by nonvenomous insect and other nonvenomous arthropods, initial encounter: Secondary | ICD-10-CM | POA: Diagnosis not present

## 2016-02-29 DIAGNOSIS — R030 Elevated blood-pressure reading, without diagnosis of hypertension: Secondary | ICD-10-CM | POA: Diagnosis not present

## 2016-02-29 DIAGNOSIS — Z79899 Other long term (current) drug therapy: Secondary | ICD-10-CM | POA: Diagnosis not present

## 2016-02-29 DIAGNOSIS — R42 Dizziness and giddiness: Secondary | ICD-10-CM | POA: Diagnosis present

## 2016-02-29 DIAGNOSIS — Z7982 Long term (current) use of aspirin: Secondary | ICD-10-CM | POA: Insufficient documentation

## 2016-02-29 LAB — CBC WITH DIFFERENTIAL/PLATELET
Basophils Absolute: 0 10*3/uL (ref 0.0–0.1)
Basophils Relative: 1 %
Eosinophils Absolute: 0.2 10*3/uL (ref 0.0–0.7)
Eosinophils Relative: 4 %
HEMATOCRIT: 41.7 % (ref 39.0–52.0)
Hemoglobin: 14.2 g/dL (ref 13.0–17.0)
LYMPHS ABS: 2.1 10*3/uL (ref 0.7–4.0)
Lymphocytes Relative: 37 %
MCH: 31.2 pg (ref 26.0–34.0)
MCHC: 34.1 g/dL (ref 30.0–36.0)
MCV: 91.6 fL (ref 78.0–100.0)
MONO ABS: 0.5 10*3/uL (ref 0.1–1.0)
MONOS PCT: 8 %
NEUTROS ABS: 2.9 10*3/uL (ref 1.7–7.7)
NEUTROS PCT: 50 %
Platelets: 174 10*3/uL (ref 150–400)
RBC: 4.55 MIL/uL (ref 4.22–5.81)
RDW: 12.9 % (ref 11.5–15.5)
WBC: 5.7 10*3/uL (ref 4.0–10.5)

## 2016-02-29 LAB — BASIC METABOLIC PANEL
ANION GAP: 8 (ref 5–15)
BUN: 15 mg/dL (ref 6–20)
CALCIUM: 9.4 mg/dL (ref 8.9–10.3)
CHLORIDE: 106 mmol/L (ref 101–111)
CO2: 24 mmol/L (ref 22–32)
CREATININE: 1.07 mg/dL (ref 0.61–1.24)
GFR calc non Af Amer: >60 mL/min (ref >60)
GLUCOSE: 119 mg/dL — AB (ref 65–99)
Potassium: 4.5 mmol/L (ref 3.5–5.1)
Sodium: 138 mmol/L (ref 135–145)

## 2016-02-29 LAB — TROPONIN I: Troponin I: <0.03 ng/mL (ref <0.031)

## 2016-02-29 MED ORDER — MECLIZINE HCL 25 MG PO TABS: 25.0000 mg | ORAL_TABLET | Freq: Three times a day (TID) | ORAL | Status: AC | PRN

## 2016-02-29 MED ORDER — DOXYCYCLINE HYCLATE 100 MG PO TABS
100.0000 mg | ORAL_TABLET | Freq: Once | ORAL | Status: AC
  Administered 2016-02-29: 100 mg via ORAL
  Filled 2016-02-29: qty 1

## 2016-02-29 MED ORDER — ASPIRIN 81 MG PO CHEW
162.0000 mg | CHEWABLE_TABLET | Freq: Once | ORAL | Status: AC
  Administered 2016-02-29: 162 mg via ORAL
  Filled 2016-02-29: qty 2

## 2016-02-29 MED ORDER — ASPIRIN 81 MG PO CHEW: 324.0000 mg | CHEWABLE_TABLET | Freq: Once | ORAL | Status: DC

## 2016-02-29 MED ORDER — DOXYCYCLINE HYCLATE 100 MG PO CAPS: 100.0000 mg | ORAL_CAPSULE | Freq: Two times a day (BID) | ORAL | Status: DC

## 2016-02-29 MED ORDER — MECLIZINE HCL 25 MG PO TABS
25.0000 mg | ORAL_TABLET | Freq: Once | ORAL | Status: AC
  Administered 2016-02-29: 25 mg via ORAL
  Filled 2016-02-29: qty 1

## 2016-02-29 NOTE — ED Provider Notes (Signed)
CSN: [id number]     Arrival date & time 02/29/16  0242 History  By signing my name below, I, Gary Buckley, attest that this documentation has been prepared under the direction and in the presence of [name], MD . Electronically Signed: [name], Scribe. 02/29/2016. 3:01 AM.   Chief Complaint  Patient presents with  . Hypertension  . Dizziness   The history is provided by the patient. No language interpreter was used.  HPI Comments:  Gary Buckley is a 65 y.o. male with PMHx of HLD, who presents to the Emergency Department complaining of gradual onset, moderate, dizziness that began 4 hours PTA. Pt states associated symptoms of hypertension (MAX 190/124), diaphoresis and pain due to indigestion. He describes his dizziness as off-balanced rather than room-spinning. Pt reports that he has had similar symptoms within the past 6 months; though he notes his HTN and duration of symptoms were the highest during this current incident. Pt states that he has taken anxiety medication and 2 Aspirin to relieve symptoms with no relief. He denies headache, chest pain, nausea and any other symptoms. No alleviating factors noted.  Past Medical History  Diagnosis Date  . OSA (obstructive sleep apnea)   . Hyperlipidemia   . Allergic rhinitis   . Back pain   . Allergy   . Sleep apnea    Past Surgical History  Procedure Laterality Date  . Nasal sinus surgery  1998  . Colonoscopy  2005  . Finger surgery      left hand-middle finger reattached 4-5 years  old   Family History  Problem Relation Age of Onset  . Allergies Father   . Allergies Sister   . Allergies Brother   . Colon cancer Maternal Uncle   . Prostate cancer Paternal Uncle     x3  . Stomach cancer Neg Hx    Social History  Substance Use Topics  . Smoking status: Former Smoker    Types: Cigars  . Smokeless tobacco: Never Used     Comment: pt states he smoke cigars maybe twice a year  . Alcohol Use: Yes     Comment: beer 6 pk / week  at the most    Review of Systems  Constitutional: Positive for diaphoresis.  Cardiovascular: Negative for chest pain.  Gastrointestinal: Negative for nausea.  Neurological: Positive for dizziness. Negative for headaches.      Allergies  Shrimp  Home Medications   Prior to Admission medications   Medication Sig Start Date End Date Taking? Authorizing Provider  ALPRAZolam Duanne Moron) 0.25 MG tablet 1/2 tablet as needed for anxiety     Historical Provider, MD  aspirin 81 MG tablet Take 81 mg by mouth daily.      Historical Provider, MD  HYDROcodone-acetaminophen (NORCO/VICODIN) 5-325 MG per tablet Take 1 tablet by mouth at bedtime and [place] repeat dose one time if needed.     Historical Provider, MD  latanoprost (XALATAN) 0.005 % ophthalmic solution Place 1 drop into both eyes at bedtime.    Historical Provider, MD  levocetirizine (XYZAL) 5 MG tablet Take 5 mg by mouth every evening.      Historical Provider, MD  magnesium 30 MG tablet Take 60 mg by mouth 2 (two) times daily.    Historical Provider, MD  montelukast (SINGULAIR) 10 MG tablet Take 10 mg by mouth at bedtime.    Historical Provider, MD  Omega-3 Fatty Acids (FISH OIL BURP-LESS PO) Take 2 capsules by mouth 2 (two) times daily.  Historical Provider, MD  timolol (BETIMOL) 0.5 % ophthalmic solution Place 1 drop into both eyes 2 (two) times daily.    Historical Provider, MD   Triage Vitals: BP 182/109 mmHg  Pulse 63  Temp(Src) 97.5 F (36.4 C) (Oral)  Resp 18  SpO2 98% Physical Exam  Constitutional: He is oriented to person, place, and time. He appears well-developed and well-nourished.  HENT:  Head: Normocephalic and atraumatic.  Eyes: Conjunctivae and EOM are normal. Pupils are equal, round, and reactive to light. Right eye exhibits no discharge. Left eye exhibits no discharge.  Neck: Normal range of motion. Neck supple. No JVD present.  No carotid bruits  Cardiovascular: Normal rate, regular rhythm and normal heart  sounds.   No murmur heard. Pulmonary/Chest: Effort normal and breath sounds normal. He has no wheezes. He has no rales. He exhibits no tenderness.  Abdominal: Soft. Bowel sounds are normal. He exhibits no distension and no mass. There is no tenderness.  Musculoskeletal: Normal range of motion. He exhibits no edema.  Lymphadenopathy:    He has no cervical adenopathy.  Neurological: He is alert and oriented to person, place, and time. No cranial nerve deficit. He exhibits normal muscle tone. Coordination normal.  Dizziness reproduced by passive head movement  Skin: Skin is warm and dry. No rash noted.  Psychiatric: He has a normal mood and affect. His behavior is normal. Thought content normal.  Nursing note and vitals reviewed.   ED Course  Procedures  DIAGNOSTIC STUDIES:  Oxygen Saturation is 98% on RA, normal by my interpretation.    COORDINATION OF CARE:  3:13 AM Discussed treatment Buckley, which includes Aspirin for dizziness and electrolytes with pt at bedside and pt agreed to Buckley.  Labs Review Results for orders placed or performed during the hospital encounter of [id number]  Basic metabolic panel  Result Value Ref Range   Sodium 138 135 - 145 mmol/L   Potassium 4.5 3.5 - 5.1 mmol/L   Chloride 106 101 - 111 mmol/L   CO2 24 22 - 32 mmol/L   Glucose, Bld 119 (H) 65 - 99 mg/dL   BUN 15 6 - 20 mg/dL   Creatinine, Ser 1.07 0.61 - 1.24 mg/dL   Calcium 9.4 8.9 - 10.3 mg/dL   GFR calc non Af Amer >60 >60 mL/min   GFR calc Af Amer >60 >60 mL/min   Anion gap 8 5 - 15  CBC with Differential  Result Value Ref Range   WBC 5.7 4.0 - 10.5 K/uL   RBC 4.55 4.22 - 5.81 MIL/uL   Hemoglobin 14.2 13.0 - 17.0 g/dL   HCT 41.7 39.0 - 52.0 %   MCV 91.6 78.0 - 100.0 fL   MCH 31.2 26.0 - 34.0 pg   MCHC 34.1 30.0 - 36.0 g/dL   RDW 12.9 11.5 - 15.5 %   Platelets 174 150 - 400 K/uL   Neutrophils Relative % 50 %   Neutro Abs 2.9 1.7 - 7.7 K/uL   Lymphocytes Relative 37 %   Lymphs Abs 2.1 0.7  - 4.0 K/uL   Monocytes Relative 8 %   Monocytes Absolute 0.5 0.1 - 1.0 K/uL   Eosinophils Relative 4 %   Eosinophils Absolute 0.2 0.0 - 0.7 K/uL   Basophils Relative 1 %   Basophils Absolute 0.0 0.0 - 0.1 K/uL  Troponin I  Result Value Ref Range   Troponin I <0.03 <0.031 ng/mL   I have personally reviewed and evaluated these lab results as part  of my medical decision-making.   EKG Interpretation   Date/Time:  Thursday February 29 2016 03:15:08 EDT Ventricular Rate:  61 PR Interval:  143 QRS Duration: 102 QT Interval:  409 QTC Calculation: 412 R Axis:   38 Text Interpretation:  Sinus rhythm Normal ECG No old tracing to compare  Confirmed by [facility]  MD, Varshini Arrants ([id number]) on 02/29/2016 3:17:20 AM      MDM   Final diagnoses:  Peripheral vertigo, unspecified laterality  Elevated blood pressure reading without diagnosis of hypertension  Tick bite of axillary region, right, initial encounter    Elevated blood pressure without evidence of end organ damage. Dizziness which seems to be peripheral vertigo given the fact that it was reproduced by passive head movement. He is given a dose of meclizine with excellent relief of symptoms. Blood pressure has come down significantly without any antihypertensive treatment. Old records are reviewed and he has had normal blood pressure readings at recent office visits. He states that he does check his blood pressure at home every day. I have advised him that unless he is having neurologic or cardiac symptoms, elevated blood pressure can be managed with his primary care provider. He does relate that he had a tick bite in his right axilla and he feels that the tick had been embedded for 3 days. There is slight redness around the site. I have evaluated it and there is some minimal erythema. However, I cannot be sure that this is not the beginning of erythema migrans. Decision is made to treat him with a one-week course of doxycycline. He is also given a  prescription for meclizine and is to follow-up with his PCP.  I personally performed the services described in this documentation, which was scribed in my presence. The recorded information has been reviewed and is accurate.      [name], MD [id number]

## 2016-02-29 NOTE — ED Notes (Signed)
MD at bedside. 

## 2016-02-29 NOTE — ED Notes (Signed)
Pt ambulatory to A12 from lobby with RN; steady gait noted

## 2016-02-29 NOTE — Discharge Instructions (Signed)
Monitor your blood pressure at home. He will need to work with your primary care provider if blood pressure stays elevated.  Vertigo Vertigo means you feel like you or your surroundings are moving when they are not. Vertigo can be dangerous if it occurs when you are at work, driving, or performing difficult activities.  CAUSES  Vertigo occurs when there is a conflict of signals sent to your brain from the visual and sensory systems in your body. There are many different causes of vertigo, including:  Infections, especially in the inner ear.  A bad reaction to a drug or misuse of alcohol and medicines.  Withdrawal from drugs or alcohol.  Rapidly changing positions, such as lying down or rolling over in bed.  A migraine headache.  Decreased blood flow to the brain.  Increased pressure in the brain from a head injury, infection, tumor, or bleeding. SYMPTOMS  You Dishon feel as though the world is spinning around or you are falling to the ground. Because your balance is upset, vertigo can cause nausea and vomiting. You Laws have involuntary eye movements (nystagmus). DIAGNOSIS  Vertigo is usually diagnosed by physical exam. If the cause of your vertigo is unknown, your caregiver Baetz perform imaging tests, such as an MRI scan (magnetic resonance imaging). TREATMENT  Most cases of vertigo resolve on their own, without treatment. Depending on the cause, your caregiver Bednarski prescribe certain medicines. If your vertigo is related to body position issues, your caregiver Wilmot recommend movements or procedures to correct the problem. In rare cases, if your vertigo is caused by certain inner ear problems, you Bloomfield need surgery. HOME CARE INSTRUCTIONS   Follow your caregiver's instructions.  Avoid driving.  Avoid operating heavy machinery.  Avoid performing any tasks that would be dangerous to you or others during a vertigo episode.  Tell your caregiver if you notice that certain medicines seem to be  causing your vertigo. Some of the medicines used to treat vertigo episodes can actually make them worse in some people. SEEK IMMEDIATE MEDICAL CARE IF:  1. Your medicines do not relieve your vertigo or are making it worse. 2. You develop problems with talking, walking, weakness, or using your arms, hands, or legs. 3. You develop severe headaches. 4. Your nausea or vomiting continues or gets worse. 5. You develop visual changes. 6. A family member notices behavioral changes. 7. Your condition gets worse. MAKE SURE YOU:  Understand these instructions.  Will watch your condition.  Will get help right away if you are not doing well or get worse.   This information is not intended to replace advice given to you by your health care provider. Make sure you discuss any questions you have with your health care provider.   Document Released: 06/26/2005 Document Revised: 12/09/2011 Document Reviewed: 01/09/2015 Elsevier Interactive Patient Education 2016 [place] Information Ticks are insects that attach themselves to the skin and draw blood for food. There are various types of ticks. Common types include wood ticks and deer ticks. Most ticks live in shrubs and grassy areas. Ticks can climb onto your body when you make contact with leaves or grass where the tick is waiting. The most common places on the body for ticks to attach themselves are the scalp, neck, armpits, waist, and groin. Most tick bites are harmless, but sometimes ticks carry germs that cause diseases. These germs can be spread to a person during the tick's feeding process. The chance of a disease spreading through  a tick bite depends on:   The type of tick.  Time of year.   How long the tick is attached.   Geographic location.  HOW CAN YOU PREVENT TICK BITES? Take these steps to help prevent tick bites when you are outdoors:  Wear protective clothing. Long sleeves and long pants are best.   Wear white  clothes so you can see ticks more easily.  Tuck your pant legs into your socks.   If walking on a trail, stay in the middle of the trail to avoid brushing against bushes.  Avoid walking through areas with long grass.  Put insect repellent on all exposed skin and along boot tops, pant legs, and sleeve cuffs.   Check clothing, hair, and skin repeatedly and before going inside.   Brush off any ticks that are not attached.  Take a shower or bath as soon as possible after being outdoors.  WHAT IS THE PROPER WAY TO REMOVE A TICK? Ticks should be removed as soon as possible to help prevent diseases caused by tick bites. 8. If latex gloves are available, put them on before trying to remove a tick.  9. Using fine-point tweezers, grasp the tick as close to the skin as possible. You Goytia also use curved forceps or a tick removal tool. Grasp the tick as close to its head as possible. Avoid grasping the tick on its body. 10. Pull gently with steady upward pressure until the tick lets go. Do not twist the tick or jerk it suddenly. This Bruhn break off the tick's head or mouth parts. 11. Do not squeeze or crush the tick's body. This could force disease-carrying fluids from the tick into your body.  12. After the tick is removed, wash the bite area and your hands with soap and water or other disinfectant such as alcohol. 13. Apply a small amount of antiseptic cream or ointment to the bite site.  14. Wash and disinfect any instruments that were used.  Do not try to remove a tick by applying a hot match, petroleum jelly, or fingernail polish to the tick. These methods do not work and Derossett increase the chances of disease being spread from the tick bite.  WHEN SHOULD YOU SEEK MEDICAL CARE? Contact your health care provider if you are unable to remove a tick from your skin or if a part of the tick breaks off and is stuck in the skin.  After a tick bite, you need to be aware of signs and symptoms that  could be related to diseases spread by ticks. Contact your health care provider if you develop any of the following in the days or weeks after the tick bite:  Unexplained fever.  Rash. A circular rash that appears days or weeks after the tick bite Santizo indicate the possibility of Lyme disease. The rash Omeara resemble a target with a bull's-eye and Squyres occur at a different part of your body than the tick bite.  Redness and swelling in the area of the tick bite.   Tender, swollen lymph glands.   Diarrhea.   Weight loss.   Cough.   Fatigue.   Muscle, joint, or bone pain.   Abdominal pain.   Headache.   Lethargy or a change in your level of consciousness.  Difficulty walking or moving your legs.   Numbness in the legs.   Paralysis.  Shortness of breath.   Confusion.   Repeated vomiting.    This information is not intended to  replace advice given to you by your health care provider. Make sure you discuss any questions you have with your health care provider.   Document Released: 09/13/2000 Document Revised: 10/07/2014 Document Reviewed: 02/24/2013 Elsevier Interactive Patient Education 2016 Elsevier Inc.  Doxycycline tablets or capsules What is this medicine? DOXYCYCLINE (dox i SYE kleen) is a tetracycline antibiotic. It kills certain bacteria or stops their growth. It is used to treat many kinds of infections, like dental, skin, respiratory, and urinary tract infections. It also treats acne, Lyme disease, malaria, and certain sexually transmitted infections. This medicine Wegner be used for other purposes; ask your health care provider or pharmacist if you have questions. What should I tell my health care provider before I take this medicine? They need to know if you have any of these conditions: -liver disease -long exposure to sunlight like working outdoors -stomach problems like colitis -an unusual or allergic reaction to doxycycline, tetracycline  antibiotics, other medicines, foods, dyes, or preservatives -pregnant or trying to get pregnant -breast-feeding How should I use this medicine? Take this medicine by mouth with a full glass of water. Follow the directions on the prescription label. It is best to take this medicine without food, but if it upsets your stomach take it with food. Take your medicine at regular intervals. Do not take your medicine more often than directed. Take all of your medicine as directed even if you think you are better. Do not skip doses or stop your medicine early. Talk to your pediatrician regarding the use of this medicine in children. While this drug Denz be prescribed for selected conditions, precautions do apply. Overdosage: If you think you have taken too much of this medicine contact a poison control center or emergency room at once. NOTE: This medicine is only for you. Do not share this medicine with others. What if I miss a dose? If you miss a dose, take it as soon as you can. If it is almost time for your next dose, take only that dose. Do not take double or extra doses. What Compean interact with this medicine? -antacids -barbiturates -birth control pills -bismuth subsalicylate -carbamazepine -methoxyflurane -other antibiotics -phenytoin -vitamins that contain iron -warfarin This list Ragone not describe all possible interactions. Give your health care provider a list of all the medicines, herbs, non-prescription drugs, or dietary supplements you use. Also tell them if you smoke, drink alcohol, or use illegal drugs. Some items Boster interact with your medicine. What should I watch for while using this medicine? Tell your doctor or health care professional if your symptoms do not improve. Do not treat diarrhea with over the counter products. Contact your doctor if you have diarrhea that lasts more than 2 days or if it is severe and watery. Do not take this medicine just before going to bed. It Sacco not  dissolve properly when you lay down and can cause pain in your throat. Drink plenty of fluids while taking this medicine to also help reduce irritation in your throat. This medicine can make you more sensitive to the sun. Keep out of the sun. If you cannot avoid being in the sun, wear protective clothing and use sunscreen. Do not use sun lamps or tanning beds/booths. Birth control pills Mould not work properly while you are taking this medicine. Talk to your doctor about using an extra method of birth control. If you are being treated for a sexually transmitted infection, avoid sexual contact until you have finished your treatment. Your sexual  partner Tiggs also need treatment. Avoid antacids, aluminum, calcium, magnesium, and iron products for 4 hours before and 2 hours after taking a dose of this medicine. If you are using this medicine to prevent malaria, you should still protect yourself from contact with mosquitos. Stay in screened-in areas, use mosquito nets, keep your body covered, and use an insect repellent. What side effects Caudillo I notice from receiving this medicine? Side effects that you should report to your doctor or health care professional as soon as possible: -allergic reactions like skin rash, itching or hives, swelling of the face, lips, or tongue -difficulty breathing -fever -itching in the rectal or genital area -pain on swallowing -redness, blistering, peeling or loosening of the skin, including inside the mouth -severe stomach pain or cramps -unusual bleeding or bruising -unusually weak or tired -yellowing of the eyes or skin Side effects that usually do not require medical attention (report to your doctor or health care professional if they continue or are bothersome): -diarrhea -loss of appetite -nausea, vomiting This list Kadrmas not describe all possible side effects. Call your doctor for medical advice about side effects. You Marcussen report side effects to FDA at  1-800-FDA-1088. Where should I keep my medicine? Keep out of the reach of children. Store at room temperature, below 30 degrees C (86 degrees F). Protect from light. Keep container tightly closed. Throw away any unused medicine after the expiration date. Taking this medicine after the expiration date can make you seriously ill. NOTE: This sheet is a summary. It Daye not cover all possible information. If you have questions about this medicine, talk to your doctor, pharmacist, or health care provider.    2016, Elsevier/Gold Standard. (2015-01-06 12:10:28)  Meclizine tablets or capsules What is this medicine? MECLIZINE (MEK li zeen) is an antihistamine. It is used to prevent nausea, vomiting, or dizziness caused by motion sickness. It is also used to prevent and treat vertigo (extreme dizziness or a feeling that you or your surroundings are tilting or spinning around). This medicine Drahos be used for other purposes; ask your health care provider or pharmacist if you have questions. What should I tell my health care provider before I take this medicine? They need to know if you have any of these conditions: -glaucoma -lung or breathing disease, like asthma -problems urinating -prostate disease -stomach or intestine problems -an unusual or allergic reaction to meclizine, other medicines, foods, dyes, or preservatives -pregnant or trying to get pregnant -breast-feeding How should I use this medicine? Take this medicine by mouth with a glass of water. Follow the directions on the prescription label. If you are using this medicine to prevent motion sickness, take the dose at least 1 hour before travel. If it upsets your stomach, take it with food or milk. Take your doses at regular intervals. Do not take your medicine more often than directed. Talk to your pediatrician regarding the use of this medicine in children. Special care Riden be needed. Overdosage: If you think you have taken too much of this  medicine contact a poison control center or emergency room at once. NOTE: This medicine is only for you. Do not share this medicine with others. What if I miss a dose? If you miss a dose, take it as soon as you can. If it is almost time for your next dose, take only that dose. Do not take double or extra doses. What Seivert interact with this medicine? Do not take this medicine with any of the following  medications: -MAOIs like Carbex, Eldepryl, Marplan, Nardil, and Parnate This medicine Blum also interact with the following medications: -alcohol -antihistamines for allergy, cough and cold -certain medicines for anxiety or sleep -certain medicines for depression, like amitriptyline, fluoxetine, sertraline -certain medicines for seizures like phenobarbital, primidone -general anesthetics like halothane, isoflurane, methoxyflurane, propofol -local anesthetics like lidocaine, pramoxine, tetracaine -medicines that relax muscles for surgery -narcotic medicines for pain -phenothiazines like chlorpromazine, mesoridazine, prochlorperazine, thioridazine This list Kozlov not describe all possible interactions. Give your health care provider a list of all the medicines, herbs, non-prescription drugs, or dietary supplements you use. Also tell them if you smoke, drink alcohol, or use illegal drugs. Some items Bertholf interact with your medicine. What should I watch for while using this medicine? Tell your doctor or healthcare professional if your symptoms do not start to get better or if they get worse. You Laguna get drowsy or dizzy. Do not drive, use machinery, or do anything that needs mental alertness until you know how this medicine affects you. Do not stand or sit up quickly, especially if you are an older patient. This reduces the risk of dizzy or fainting spells. Alcohol Hansman interfere with the effect of this medicine. Avoid alcoholic drinks. Your mouth Lagace get dry. Chewing sugarless gum or sucking hard candy, and  drinking plenty of water Pepin help. Contact your doctor if the problem does not go away or is severe. This medicine Pau cause dry eyes and blurred vision. If you wear contact lenses you Cutler feel some discomfort. Lubricating drops Keim help. See your eye doctor if the problem does not go away or is severe. What side effects Thalmann I notice from receiving this medicine? Side effects that you should report to your doctor or health care professional as soon as possible: -feeling faint or lightheaded, falls -fast, irregular heartbeat Side effects that usually do not require medical attention (report these to your doctor or health care professional if they continue or are bothersome): -constipation -headache -trouble passing urine or change in the amount of urine -trouble sleeping -upset stomach This list Errico not describe all possible side effects. Call your doctor for medical advice about side effects. You Lorusso report side effects to FDA at 1-800-FDA-1088. Where should I keep my medicine? Keep out of the reach of children. Store at room temperature between 15 and 30 degrees C (59 and 86 degrees F). Keep container tightly closed. Throw away any unused medicine after the expiration date. NOTE: This sheet is a summary. It Buchmann not cover all possible information. If you have questions about this medicine, talk to your doctor, pharmacist, or health care provider.    2016, Elsevier/Gold Standard. (2015-03-23 09:20:57)

## 2016-02-29 NOTE — ED Notes (Signed)
Pt states his BP at home was 190/90. States he got dizzy at 2300 and checked his BP. States that she dizziness has gotten somewhat better. States he does not take any BP meds.

## 2016-11-01 ENCOUNTER — Other Ambulatory Visit: Payer: Self-pay | Admitting: Physician Assistant

## 2016-11-01 ENCOUNTER — Ambulatory Visit
Admission: RE | Admit: 2016-11-01 | Discharge: 2016-11-01 | Disposition: A | Discharge status: Home / Self Care | Payer: 59 | Source: Ambulatory Visit | Attending: Physician Assistant | Admitting: Physician Assistant

## 2016-11-01 DIAGNOSIS — M79673 Pain in unspecified foot: Secondary | ICD-10-CM

## 2019-06-08 ENCOUNTER — Telehealth: Payer: Self-pay | Admitting: Pulmonary Disease

## 2019-06-08 NOTE — Telephone Encounter (Signed)
ATC pt, line went to voicemail. LMTCB x1. 

## 2019-06-08 NOTE — Telephone Encounter (Signed)
If there is no answer it is ok to leave message pt was las seen ion 2017 though?Gary Buckley

## 2019-06-10 NOTE — Telephone Encounter (Signed)
Pt will need an OV before we can reorder CPAP supplies for him. LMTCB x2 for pt.

## 2019-06-11 NOTE — Telephone Encounter (Signed)
LMTCB x3 for pt. We have attempted to contact pt several times with no success or call back from pt. Per triage protocol, message will be closed.   

## 2019-06-21 ENCOUNTER — Encounter: Payer: Self-pay | Admitting: Pulmonary Disease

## 2019-06-21 ENCOUNTER — Ambulatory Visit (INDEPENDENT_AMBULATORY_CARE_PROVIDER_SITE_OTHER): Payer: Medicare Other | Admitting: Pulmonary Disease

## 2019-06-21 ENCOUNTER — Other Ambulatory Visit: Payer: Self-pay

## 2019-06-21 DIAGNOSIS — G4733 Obstructive sleep apnea (adult) (pediatric): Secondary | ICD-10-CM

## 2019-06-21 NOTE — Progress Notes (Signed)
[facility]  Chief Complaint  Patient presents with  . Follow-up    Patient reports that he is sleeping well and is in need of a new machine and supplies.     Constitutional:  There were no vitals taken for this visit.  Deferred.  Past Medical History:  HLD, Allergies, Back pain  Brief Summary:  Gary Buckley is a 68 y.o. male with obstructive sleep apnea.  Virtual Visit via Telephone Note  I connected with Gary Buckley on 06/21/19 at 11:30 AM EDT by telephone and verified that I am speaking with the correct person using two identifiers.  Location: Patient: his car Provider: medical office   I discussed the limitations, risks, security and privacy concerns of performing an evaluation and management service by telephone and the availability of in person appointments. I also discussed with the patient that there Stonehouse be a patient responsible charge related to this service. The patient expressed understanding and agreed to proceed.  He has been using CPAP.  Has nasal mask.  No issues with mask fit.  Denies sinus congestion, sore throat, dry mouth, or aerophagia.  His machine is very old and not sure it is working.  Uses [facility].  Physical Exam:   Deferred.   Assessment/Plan:   Obstructive sleep apnea. he is compliant with CPAP and reports benefit - his machine is more than 70 yrs old, is broke and can't be fixed - will arrange for new CPAP machine at 9 cm H2O   Patient Instructions  Will arrange for new CPAP machine  Follow up in 2 months   I discussed the assessment and treatment plan with the patient. The patient was provided an opportunity to ask questions and all were answered. The patient agreed with the plan and demonstrated an understanding of the instructions.   The patient was advised to call back or seek an in-person evaluation if the symptoms worsen or if the condition fails to improve as anticipated.  I  provided 14 minutes of non-face-to-face time during this encounter.  Chesley Mires, MD [place] Pager: [phone] 06/21/2019, 12:08 PM  Flow Sheet    Sleep tests:  PSG 06/21/09 >> AHI 75 CPAP 01/04/16 to 02/02/16 >> used on 29 of 30 nights with average 6 hrs and 55 min.  Average AHI is 1.2 with median CPAP 9 cm H2O.  Medications:   Allergies as of 06/21/2019      Reactions   Shrimp [shellfish Allergy] Nausea And Vomiting      Medication List       Accurate as of June 21, 2019 12:08 PM. If you have any questions, ask your nurse or doctor.        STOP taking these medications   doxycycline 100 MG capsule Commonly known as: VIBRAMYCIN Stopped by: Chesley Mires, MD   HYDROcodone-acetaminophen 5-325 MG tablet Commonly known as: NORCO/VICODIN Stopped by: Chesley Mires, MD     TAKE these medications   ALPRAZolam 0.25 MG tablet Commonly known as: XANAX 1/2 tablet as needed for anxiety   aspirin 81 MG tablet Take 81 mg by mouth daily.   FISH OIL BURP-LESS PO Take 2 capsules by mouth 2 (two) times daily.   latanoprost 0.005 % ophthalmic solution Commonly known as: XALATAN Place 1 drop into both eyes at bedtime.   levocetirizine 5 MG tablet Commonly known as: XYZAL Take 5 mg by mouth every evening.   magnesium 30 MG tablet Take  60 mg by mouth 2 (two) times daily.   meclizine 25 MG tablet Commonly known as: ANTIVERT Take 1 tablet (25 mg total) by mouth 3 (three) times daily as needed for dizziness.   montelukast 10 MG tablet Commonly known as: SINGULAIR Take 10 mg by mouth at bedtime.   simvastatin 20 MG tablet Commonly known as: ZOCOR Take 20 mg by mouth every evening.   timolol 0.5 % ophthalmic solution Commonly known as: BETIMOL Place 1 drop into both eyes 2 (two) times daily.   vitamin C 1000 MG tablet Take 1,000 mg by mouth daily.       Past Surgical History:  He  has a past surgical history that includes Nasal sinus  surgery (1998); Colonoscopy (2005); and Finger surgery.  Family History:  His family history includes Allergies in his brother, father, and sister; Colon cancer in his maternal uncle; Prostate cancer in his paternal uncle.  Social History:  He  reports that he has quit smoking. His smoking use included cigars. He has never used smokeless tobacco. He reports current alcohol use. He reports that he does not use drugs.

## 2019-06-21 NOTE — Patient Instructions (Signed)
Will arrange for new CPAP machine  Follow up in 2 months 

## 2019-10-19 ENCOUNTER — Ambulatory Visit (INDEPENDENT_AMBULATORY_CARE_PROVIDER_SITE_OTHER): Payer: Medicare Other | Admitting: Pulmonary Disease

## 2019-10-19 ENCOUNTER — Encounter: Payer: Self-pay | Admitting: Pulmonary Disease

## 2019-10-19 ENCOUNTER — Other Ambulatory Visit: Payer: Self-pay

## 2019-10-19 DIAGNOSIS — G4733 Obstructive sleep apnea (adult) (pediatric): Secondary | ICD-10-CM

## 2019-10-19 DIAGNOSIS — Z9989 Dependence on other enabling machines and devices: Secondary | ICD-10-CM

## 2019-10-19 DIAGNOSIS — R682 Dry mouth, unspecified: Secondary | ICD-10-CM

## 2019-10-19 NOTE — Patient Instructions (Signed)
Will have your CPAP setting changed to 7 cm water  Call if mouth dryness persists  Follow up in 1 year

## 2019-10-19 NOTE — Progress Notes (Signed)
[facility]  Chief Complaint  Patient presents with  . Follow-up    OSA on CPAP    Constitutional:  There were no vitals taken for this visit.  Deferred.  Past Medical History:  HLD, Allergies, Back pain  Brief Summary:  Gary Buckley is a 69 y.o. male with obstructive sleep apnea.  Virtual Visit via Telephone Note  I connected with Gary Buckley on 10/19/19 at 11:30 AM EST by telephone and verified that I am speaking with the correct person using two identifiers.  Location: Patient: home Provider: medical office   I discussed the limitations, risks, security and privacy concerns of performing an evaluation and management service by telephone and the availability of in person appointments. I also discussed with the patient that there Mims be a patient responsible charge related to this service. The patient expressed understanding and agreed to proceed.  Since his last visit he got new CPAP machine.  Has nasal mask.  Main issue is he is snoring some and getting more of a dry mouth.  Physical Exam:   Deferred.   Assessment/Plan:   Obstructive sleep apnea. - he is compliant with CPAP and reports benefit - he is getting more dry mouth since getting new machine - will try changing CPAP from 9 to 7 cm H2O - discussed how to adjust humidifier setting - might need mask refit; he tried full face mask before, but wasn't able to tolerate this   Patient Instructions  Will have your CPAP setting changed to 7 cm water  Call if mouth dryness persists  Follow up in 1 year     I discussed the assessment and treatment plan with the patient. The patient was provided an opportunity to ask questions and all were answered. The patient agreed with the plan and demonstrated an understanding of the instructions.   The patient was advised to call back or seek an in-person evaluation if the symptoms worsen or if the condition fails to improve  as anticipated.  I provided 16 minutes of non-face-to-face time during this encounter.    Chesley Mires, MD [facility] Pager: [phone] 10/19/2019, 11:43 AM  Flow Sheet    Sleep tests:  PSG 06/21/09 >> AHI 75 CPAP 09/18/19 to 10/17/19 >> used on 30 of 30 nights with average 5 hrs 56 min.  Average AHI 1 with CPAP 9 cm H2O.  Medications:   Allergies as of 10/19/2019      Reactions   Lisinopril Other (See Comments)   Dizziness   Shrimp [shellfish Allergy] Nausea And Vomiting      Medication List       Accurate as of October 19, 2019 11:43 AM. If you have any questions, ask your nurse or doctor.        STOP taking these medications   lisinopril 10 MG tablet Commonly known as: ZESTRIL Stopped by: Chesley Mires, MD   magnesium 30 MG tablet Stopped by: Chesley Mires, MD     TAKE these medications   ALPRAZolam 0.25 MG tablet Commonly known as: XANAX 1/2 tablet as needed for anxiety   aspirin 81 MG tablet Take 81 mg by mouth daily.   FISH OIL BURP-LESS PO Take 2 capsules by mouth 2 (two) times daily.   hydrochlorothiazide 25 MG tablet Commonly known as: HYDRODIURIL Take 25 mg by mouth daily.   latanoprost 0.005 % ophthalmic solution Commonly known as: XALATAN Place 1 drop into both eyes at bedtime.  levocetirizine 5 MG tablet Commonly known as: XYZAL Take 5 mg by mouth every evening.   meclizine 25 MG tablet Commonly known as: ANTIVERT Take 1 tablet (25 mg total) by mouth 3 (three) times daily as needed for dizziness.   montelukast 10 MG tablet Commonly known as: SINGULAIR Take 10 mg by mouth at bedtime.   simvastatin 20 MG tablet Commonly known as: ZOCOR Take 20 mg by mouth every evening.   timolol 0.5 % ophthalmic solution Commonly known as: BETIMOL Place 1 drop into both eyes 2 (two) times daily.   vitamin C 1000 MG tablet Take 1,000 mg by mouth daily.       Past Surgical History:  He  has a past surgical history that  includes Nasal sinus surgery (1998); Colonoscopy (2005); and Finger surgery.  Family History:  His family history includes Allergies in his brother, father, and sister; Colon cancer in his maternal uncle; Prostate cancer in his paternal uncle.  Social History:  He  reports that he has quit smoking. His smoking use included cigars. He has never used smokeless tobacco. He reports current alcohol use. He reports that he does not use drugs.

## 2020-03-06 ENCOUNTER — Other Ambulatory Visit: Payer: Self-pay | Admitting: Family Medicine

## 2020-03-06 ENCOUNTER — Ambulatory Visit
Admission: RE | Admit: 2020-03-06 | Discharge: 2020-03-06 | Disposition: A | Discharge status: Home / Self Care | Payer: Medicare Other | Source: Ambulatory Visit | Attending: Family Medicine | Admitting: Family Medicine

## 2020-03-06 DIAGNOSIS — M542 Cervicalgia: Secondary | ICD-10-CM

## 2020-11-09 DIAGNOSIS — E119 Type 2 diabetes mellitus without complications: Secondary | ICD-10-CM | POA: Diagnosis not present

## 2020-11-21 ENCOUNTER — Ambulatory Visit (INDEPENDENT_AMBULATORY_CARE_PROVIDER_SITE_OTHER): Payer: Worker's Compensation

## 2020-11-21 ENCOUNTER — Ambulatory Visit (INDEPENDENT_AMBULATORY_CARE_PROVIDER_SITE_OTHER): Payer: Worker's Compensation | Admitting: Pulmonary Disease

## 2020-11-21 ENCOUNTER — Encounter: Payer: Self-pay | Admitting: Pulmonary Disease

## 2020-11-21 ENCOUNTER — Other Ambulatory Visit: Payer: Self-pay

## 2020-11-21 VITALS — BP 122/76 | HR 52 | Temp 97.3°F | Ht 76.0 in | Wt 253.8 lb

## 2020-11-21 DIAGNOSIS — Z77090 Contact with and (suspected) exposure to asbestos: Secondary | ICD-10-CM

## 2020-11-21 NOTE — Progress Notes (Signed)
[name]  Chief Complaint  Patient presents with  . Follow-up    Been having dizziness mainly at night going on for years    Constitutional:  BP 122/76 (BP Location: Right Arm, Cuff Size: Normal)   Pulse (!) 52   Temp (!) 97.3 F (36.3 C) (Temporal)   Ht 6\' 4"  (1.93 m)   Wt 253 lb 12.8 oz (115.1 kg)   SpO2 98% Comment: Room air  BMI 30.89 kg/m   Past Medical History:  HLD, Allergies, Back pain  Past Surgical History:  He  has a past surgical history that includes Nasal sinus surgery (1998); Colonoscopy (2005); and Finger surgery.  Brief Summary:  Gary Buckley is a 70 y.o. male former smoker with obstructive sleep apnea and history of asbestos exposure.       Subjective:   He reports having asbestos exposure while working with [employer].  He is part of a class action settlement and needs to get annual chest xray and pulmonary function testing to monitor for asbestos related lung disease.  He uses CPAP nightly.  No issues with mask fit.  Continues to lose weight, but not at goal weight yet.  Has episodes feeling dizzy and light headed when he stands to quickly.  Last for few seconds.  Mostly at night.  Physical Exam:   Appearance - well kempt   ENMT - no sinus tenderness, no oral exudate, no LAN, Mallampati 3 airway, no stridor  Respiratory - equal breath sounds bilaterally, no wheezing or rales  CV - s1s2 regular rate and rhythm, no murmurs  Ext - no clubbing, no edema  Skin - no rashes  Psych - normal mood and affect   Pulmonary testing:    Chest Imaging:    Sleep Tests:   PSG 06/21/09 >> AHI 75  CPAP 10/21/20 to 11/19/20 >> used on 30 of 30 nights with average 5 hrs 5 min.  Average AHI 2.3 with CPAP 7 cm H2O  Cardiac Tests:    Social History:  He  reports that he has quit smoking. His smoking use included cigars. He has never used smokeless tobacco. He reports current alcohol use. He reports that  he does not use drugs.  Family History:  His family history includes Allergies in his brother, father, and sister; Colon cancer in his maternal uncle; Prostate cancer in his paternal uncle.     Assessment/Plan:   Obstructive sleep apnea. - he is compliant with therapy and reports benefit from CPAP - uses [employer] for his DME - continue CPAP 7 cm H2O - he is hopefully with continued weight loss he will be able to transition off CPAP  History of asbestos exposure. - will arrange for chest xray and pulmonary function testing to monitor for asbestos related lung disease  Postural dizziness. - advised him to f/u with his PCP if this progresses and that he might need further cardiology assesment  Time Spent Involved in Patient Care on Day of Examination:  32 minutes  Follow up:  Patient Instructions  Chest xray today  Will arrange for breathing test  Follow up in 1 year   Medication List:   Allergies as of 11/21/2020      Reactions   Lisinopril Other (See Comments)   Dizziness   Shrimp [shellfish Allergy] Nausea And Vomiting      Medication List       Accurate as of November 21, 2020 10:38 AM. If you  have any questions, ask your nurse or doctor.        ALPRAZolam 0.25 MG tablet Commonly known as: XANAX 1/2 tablet as needed for anxiety   aspirin 81 MG tablet Take 81 mg by mouth daily.   FISH OIL BURP-LESS PO Take 2 capsules by mouth 2 (two) times daily.   hydrochlorothiazide 25 MG tablet Commonly known as: HYDRODIURIL Take 25 mg by mouth daily.   latanoprost 0.005 % ophthalmic solution Commonly known as: XALATAN Place 1 drop into both eyes at bedtime.   levocetirizine 5 MG tablet Commonly known as: XYZAL Take 5 mg by mouth every evening.   meclizine 25 MG tablet Commonly known as: ANTIVERT Take 1 tablet (25 mg total) by mouth 3 (three) times daily as needed for dizziness.   montelukast 10 MG tablet Commonly known as: SINGULAIR Take 10 mg by mouth at  bedtime.   simvastatin 20 MG tablet Commonly known as: ZOCOR Take 20 mg by mouth every evening.   timolol 0.5 % ophthalmic solution Commonly known as: BETIMOL Place 1 drop into both eyes 2 (two) times daily.   vitamin C 1000 MG tablet Take 1,000 mg by mouth daily.       Signature:  Chesley Mires, MD [place] [place] Pager - [phone] 11/21/2020, 10:38 AM

## 2020-11-21 NOTE — Patient Instructions (Signed)
Chest xray today  Will arrange for breathing test  Follow up in 1 year

## 2020-11-22 ENCOUNTER — Encounter: Payer: Self-pay | Admitting: *Deleted

## 2020-11-28 ENCOUNTER — Other Ambulatory Visit (HOSPITAL_COMMUNITY)
Admission: RE | Admit: 2020-11-28 | Discharge: 2020-11-28 | Disposition: A | Discharge status: Home / Self Care | Payer: Medicare Other | Source: Ambulatory Visit | Attending: Pulmonary Disease | Admitting: Pulmonary Disease

## 2020-11-28 DIAGNOSIS — Z01812 Encounter for preprocedural laboratory examination: Secondary | ICD-10-CM | POA: Insufficient documentation

## 2020-11-28 DIAGNOSIS — Z20822 Contact with and (suspected) exposure to covid-19: Secondary | ICD-10-CM | POA: Insufficient documentation

## 2020-11-29 LAB — SARS CORONAVIRUS 2 (TAT 6-24 HRS): SARS Coronavirus 2: NEGATIVE

## 2020-12-01 ENCOUNTER — Ambulatory Visit (INDEPENDENT_AMBULATORY_CARE_PROVIDER_SITE_OTHER): Payer: Worker's Compensation | Admitting: Pulmonary Disease

## 2020-12-01 ENCOUNTER — Other Ambulatory Visit: Payer: Self-pay

## 2020-12-01 DIAGNOSIS — Z77090 Contact with and (suspected) exposure to asbestos: Secondary | ICD-10-CM

## 2020-12-01 LAB — PULMONARY FUNCTION TEST
DL/VA % pred: 109 %
DL/VA: 4.3 ml/min/mmHg/L
DLCO cor % pred: 104 %
DLCO cor: 32.18 ml/min/mmHg
DLCO unc % pred: 104 %
DLCO unc: 32.18 ml/min/mmHg
FEF 25-75 Post: 3.89 L/sec
FEF 25-75 Pre: 4.15 L/sec
FEF2575-%Change-Post: -6 %
FEF2575-%Pred-Post: 129 %
FEF2575-%Pred-Pre: 137 %
FEV1-%Change-Post: -1 %
FEV1-%Pred-Post: 105 %
FEV1-%Pred-Pre: 107 %
FEV1-Post: 4.23 L
FEV1-Pre: 4.3 L
FEV1FVC-%Change-Post: 6 %
FEV1FVC-%Pred-Pre: 108 %
FEV6-%Change-Post: -6 %
FEV6-%Pred-Post: 96 %
FEV6-%Pred-Pre: 103 %
FEV6-Post: 5.01 L
FEV6-Pre: 5.37 L
FEV6FVC-%Change-Post: 0 %
FEV6FVC-%Pred-Post: 105 %
FEV6FVC-%Pred-Pre: 104 %
FVC-%Change-Post: -7 %
FVC-%Pred-Post: 91 %
FVC-%Pred-Pre: 99 %
FVC-Post: 5.01 L
FVC-Pre: 5.4 L
Post FEV1/FVC ratio: 84 %
Post FEV6/FVC ratio: 100 %
Pre FEV1/FVC ratio: 80 %
Pre FEV6/FVC Ratio: 99 %
RV % pred: 107 %
RV: 3.01 L
TLC % pred: 97 %
TLC: 8 L

## 2020-12-01 NOTE — Progress Notes (Signed)
PFT done today. 

## 2020-12-12 ENCOUNTER — Encounter: Payer: Self-pay | Admitting: *Deleted

## 2020-12-12 DIAGNOSIS — G4733 Obstructive sleep apnea (adult) (pediatric): Secondary | ICD-10-CM | POA: Diagnosis not present

## 2020-12-26 DIAGNOSIS — H401131 Primary open-angle glaucoma, bilateral, mild stage: Secondary | ICD-10-CM | POA: Diagnosis not present

## 2020-12-26 DIAGNOSIS — H02403 Unspecified ptosis of bilateral eyelids: Secondary | ICD-10-CM | POA: Diagnosis not present

## 2021-01-04 DIAGNOSIS — E119 Type 2 diabetes mellitus without complications: Secondary | ICD-10-CM | POA: Diagnosis not present

## 2021-02-13 DIAGNOSIS — G4733 Obstructive sleep apnea (adult) (pediatric): Secondary | ICD-10-CM | POA: Diagnosis not present

## 2021-02-28 DIAGNOSIS — L409 Psoriasis, unspecified: Secondary | ICD-10-CM | POA: Diagnosis not present

## 2021-03-27 DIAGNOSIS — E1169 Type 2 diabetes mellitus with other specified complication: Secondary | ICD-10-CM | POA: Diagnosis not present

## 2021-03-27 DIAGNOSIS — L409 Psoriasis, unspecified: Secondary | ICD-10-CM | POA: Diagnosis not present

## 2021-03-27 DIAGNOSIS — E782 Mixed hyperlipidemia: Secondary | ICD-10-CM | POA: Diagnosis not present

## 2021-03-27 DIAGNOSIS — I10 Essential (primary) hypertension: Secondary | ICD-10-CM | POA: Diagnosis not present

## 2021-05-15 DIAGNOSIS — G4733 Obstructive sleep apnea (adult) (pediatric): Secondary | ICD-10-CM | POA: Diagnosis not present

## 2021-07-03 DIAGNOSIS — H04123 Dry eye syndrome of bilateral lacrimal glands: Secondary | ICD-10-CM | POA: Diagnosis not present

## 2021-07-03 DIAGNOSIS — E119 Type 2 diabetes mellitus without complications: Secondary | ICD-10-CM | POA: Diagnosis not present

## 2021-07-03 DIAGNOSIS — H401131 Primary open-angle glaucoma, bilateral, mild stage: Secondary | ICD-10-CM | POA: Diagnosis not present

## 2021-07-03 DIAGNOSIS — H43813 Vitreous degeneration, bilateral: Secondary | ICD-10-CM | POA: Diagnosis not present

## 2021-07-12 DIAGNOSIS — L409 Psoriasis, unspecified: Secondary | ICD-10-CM | POA: Diagnosis not present

## 2021-07-12 DIAGNOSIS — E119 Type 2 diabetes mellitus without complications: Secondary | ICD-10-CM | POA: Diagnosis not present

## 2021-07-12 DIAGNOSIS — L80 Vitiligo: Secondary | ICD-10-CM | POA: Diagnosis not present

## 2021-07-12 DIAGNOSIS — D485 Neoplasm of uncertain behavior of skin: Secondary | ICD-10-CM | POA: Diagnosis not present

## 2021-08-13 DIAGNOSIS — G4733 Obstructive sleep apnea (adult) (pediatric): Secondary | ICD-10-CM | POA: Diagnosis not present

## 2021-09-06 ENCOUNTER — Other Ambulatory Visit: Payer: Self-pay | Admitting: Family Medicine

## 2021-09-06 DIAGNOSIS — R29898 Other symptoms and signs involving the musculoskeletal system: Secondary | ICD-10-CM

## 2021-09-06 DIAGNOSIS — M545 Low back pain, unspecified: Secondary | ICD-10-CM | POA: Diagnosis not present

## 2021-09-06 DIAGNOSIS — R270 Ataxia, unspecified: Secondary | ICD-10-CM | POA: Diagnosis not present

## 2021-09-17 DIAGNOSIS — Z23 Encounter for immunization: Secondary | ICD-10-CM | POA: Diagnosis not present

## 2021-09-17 DIAGNOSIS — D1801 Hemangioma of skin and subcutaneous tissue: Secondary | ICD-10-CM | POA: Diagnosis not present

## 2021-09-17 DIAGNOSIS — L821 Other seborrheic keratosis: Secondary | ICD-10-CM | POA: Diagnosis not present

## 2021-09-17 DIAGNOSIS — L570 Actinic keratosis: Secondary | ICD-10-CM | POA: Diagnosis not present

## 2021-09-17 DIAGNOSIS — L820 Inflamed seborrheic keratosis: Secondary | ICD-10-CM | POA: Diagnosis not present

## 2021-09-17 DIAGNOSIS — L578 Other skin changes due to chronic exposure to nonionizing radiation: Secondary | ICD-10-CM | POA: Diagnosis not present

## 2021-09-17 DIAGNOSIS — D229 Melanocytic nevi, unspecified: Secondary | ICD-10-CM | POA: Diagnosis not present

## 2021-09-21 ENCOUNTER — Other Ambulatory Visit: Payer: Self-pay

## 2021-09-21 ENCOUNTER — Ambulatory Visit
Admission: RE | Admit: 2021-09-21 | Discharge: 2021-09-21 | Disposition: A | Discharge status: Home / Self Care | Payer: Medicare Other | Source: Ambulatory Visit | Attending: Family Medicine | Admitting: Family Medicine

## 2021-09-21 DIAGNOSIS — R29898 Other symptoms and signs involving the musculoskeletal system: Secondary | ICD-10-CM

## 2021-09-21 DIAGNOSIS — M48061 Spinal stenosis, lumbar region without neurogenic claudication: Secondary | ICD-10-CM | POA: Diagnosis not present

## 2021-09-21 DIAGNOSIS — M545 Low back pain, unspecified: Secondary | ICD-10-CM | POA: Diagnosis not present

## 2021-09-30 ENCOUNTER — Encounter (HOSPITAL_COMMUNITY): Payer: Self-pay

## 2021-09-30 ENCOUNTER — Ambulatory Visit (HOSPITAL_COMMUNITY)
Admission: EM | Admit: 2021-09-30 | Discharge: 2021-09-30 | Disposition: A | Discharge status: Home / Self Care | Payer: Medicare Other | Attending: Physician Assistant | Admitting: Physician Assistant

## 2021-09-30 ENCOUNTER — Other Ambulatory Visit: Payer: Self-pay

## 2021-09-30 DIAGNOSIS — J0140 Acute pansinusitis, unspecified: Secondary | ICD-10-CM | POA: Diagnosis not present

## 2021-09-30 MED ORDER — PREDNISONE 20 MG PO TABS: 20.0000 mg | ORAL_TABLET | Freq: Every day | ORAL | 0 refills | Status: DC

## 2021-09-30 MED ORDER — AMOXICILLIN-POT CLAVULANATE 875-125 MG PO TABS: 1.0000 | ORAL_TABLET | Freq: Two times a day (BID) | ORAL | 0 refills | Status: DC

## 2021-09-30 NOTE — ED Provider Notes (Addendum)
[facility]    CSN: [id number] Arrival date & time: 09/30/21  1039      History   Chief Complaint Chief Complaint  Patient presents with   Sore Throat   Nasal Congestion   Cough    HPI Gary Buckley is a 71 y.o. male.   Patient presents today with a several week history of sinus congestion.  He has a history of sinus infections and states current symptoms are similar to previous episodes of this condition.  He has had sinus surgery approximately 8 years ago at [facility].  He reports cough, sore throat, nasal congestion, sneezing.  Denies any fever, chest pain, shortness of breath, nausea, vomiting.  He has tried Mucinex as well as sinus rinses with only temporary improvement of symptoms.  Denies any recent antibiotics.  He denies history of asthma, COPD, smoking.  He does have allergies and is taking allergy medication as prescribed.  He reports often requiring prednisone in addition antibiotic to manage symptoms.  He does have a history of diabetes but reports this is very well controlled with last A1c 6.2% and managed with dietary changes alone.   Past Medical History:  Diagnosis Date   Allergic rhinitis    Allergy    Back pain    Hyperlipidemia    OSA (obstructive sleep apnea)    Sleep apnea     Patient Active Problem List   Diagnosis Date Noted   HYPERLIPIDEMIA 07/05/2009   Obstructive sleep apnea 07/05/2009   ALLERGIC RHINITIS 07/05/2009    Past Surgical History:  Procedure Laterality Date   COLONOSCOPY  2005   FINGER SURGERY     left hand-middle finger reattached 4-5 years  old   [facility] Medications    Prior to Admission medications   Medication Sig Start Date End Date Taking? Authorizing Provider  amoxicillin-clavulanate (AUGMENTIN) 875-125 MG tablet Take 1 tablet by mouth every 12 (twelve) hours. 09/30/21  Yes Gokul Waybright, Derry Skill, PA-C  ALPRAZolam (XANAX) 0.25 MG tablet 1/2 tablet as needed for anxiety     [provider]  Ascorbic Acid (VITAMIN C) 1000 MG tablet Take 1,000 mg by mouth daily.     [provider]  aspirin 81 MG tablet Take 81 mg by mouth daily.    [provider]  hydrochlorothiazide (HYDRODIURIL) 25 MG tablet Take 25 mg by mouth daily.    [provider]  latanoprost (XALATAN) 0.005 % ophthalmic solution Place 1 drop into both eyes at bedtime.    [provider]  levocetirizine (XYZAL) 5 MG tablet Take 5 mg by mouth every evening.    [provider]  meclizine (ANTIVERT) 25 MG tablet Take 1 tablet (25 mg total) by mouth 3 (three) times daily as needed for dizziness. 01/30/65   [name], MD  montelukast (SINGULAIR) 10 MG tablet Take 10 mg by mouth at bedtime.    [provider]  Omega-3 Fatty Acids (FISH OIL BURP-LESS PO) Take 2 capsules by mouth 2 (two) times daily.     [provider]  simvastatin (ZOCOR) 20 MG tablet Take 20 mg by mouth every evening.  04/15/19   [provider]  timolol (BETIMOL) 0.5 % ophthalmic solution Place 1 drop into both eyes 2 (two) times daily.    [provider]    Family History Family History  Problem Relation Age of Onset   Allergies Father    Allergies  Sister    Allergies Brother    Colon cancer Maternal Uncle    Prostate cancer Paternal Uncle        x3   Stomach cancer Neg Hx     Social History Social History   Tobacco Use   Smoking status: Former    Types: Cigars   Smokeless tobacco: Never  Substance Use Topics   Alcohol use: Yes    Comment: beer 6 pk / week at the most   Drug use: No     Allergies   Lisinopril and Shrimp [shellfish allergy]   Review of Systems Review of Systems  Constitutional:  Positive for activity change. Negative for appetite change, fatigue and fever.  HENT:  Positive for congestion, postnasal drip, sinus pressure and sore throat. Negative for sneezing.   Respiratory:  Positive for cough. Negative for  shortness of breath.   Cardiovascular:  Negative for chest pain.  Gastrointestinal:  Negative for abdominal pain, diarrhea, nausea and vomiting.  Neurological:  Positive for headaches. Negative for dizziness and light-headedness.    Physical Exam Triage Vital Signs ED Triage Vitals  Enc Vitals Group     BP 09/30/21 1305 (!) 146/84     Pulse Rate 09/30/21 1305 68     Resp 09/30/21 1305 19     Temp 09/30/21 1305 98.2 F (36.8 C)     Temp Source 09/30/21 1305 Oral     SpO2 09/30/21 1305 98 %     Weight --      Height --      Head Circumference --      Peak Flow --      Pain Score 09/30/21 1303 3     Pain Loc --      Pain Edu? --      Excl. in [place]? --    No data found.  Updated Vital Signs BP (!) 146/84 (BP Location: Right Arm)    Pulse 68    Temp 98.2 F (36.8 C) (Oral)    Resp 19    SpO2 98%   Visual Acuity Right Eye Distance:   Left Eye Distance:   Bilateral Distance:    Right Eye Near:   Left Eye Near:    Bilateral Near:     Physical Exam Vitals reviewed.  Constitutional:      General: He is awake.     Appearance: Normal appearance. He is well-developed. He is not ill-appearing.     Comments: Very pleasant male appears stated age in no acute distress sitting comfortably in exam room  HENT:     Head: Normocephalic and atraumatic.     Right Ear: Tympanic membrane, ear canal and external ear normal. Tympanic membrane is not erythematous or bulging.     Left Ear: Tympanic membrane, ear canal and external ear normal. Tympanic membrane is not erythematous or bulging.     Nose:     Right Sinus: Maxillary sinus tenderness and frontal sinus tenderness present.     Left Sinus: Maxillary sinus tenderness and frontal sinus tenderness present.     Mouth/Throat:     Pharynx: Uvula midline. Posterior oropharyngeal erythema present. No oropharyngeal exudate or uvula swelling.     Comments: Drainage present posterior oropharynx Cardiovascular:     Rate and Rhythm: Normal rate  and regular rhythm.     Heart sounds: Normal heart sounds, S1 normal and S2 normal. No murmur heard. Pulmonary:     Effort: Pulmonary effort is normal. No accessory muscle usage  or respiratory distress.     Breath sounds: Normal breath sounds. No stridor. No wheezing, rhonchi or rales.     Comments: Clear to auscultation bilaterally Abdominal:     General: Bowel sounds are normal.     Palpations: Abdomen is soft.     Tenderness: There is no abdominal tenderness.  Neurological:     Mental Status: He is alert.  Psychiatric:        Behavior: Behavior is cooperative.     UC Treatments / Results  Labs (all labs ordered are listed, but only abnormal results are displayed) Labs Reviewed - No data to display  EKG   Radiology No results found.  Procedures Procedures (including critical care time)  Medications Ordered in UC Medications - No data to display  Initial Impression / Assessment and Plan / UC Course  I have reviewed the triage vital signs and the nursing notes.  Pertinent labs & imaging results that were available during my care of the patient were reviewed by me and considered in my medical decision making (see chart for details).     No indication for viral testing given patient has been symptomatic for several weeks and this would not change management.  Given prolonged and worsening symptoms we will start Augmentin twice daily for 7 days.  Patient reports often requiring prednisone but review of chart shows that he has history of glaucoma so this was deferred. He can continue over-the-counter medication including Mucinex and Flonase.  He is to rest and drink plenty of fluid.  Discussed alarm symptoms that warrant emergent evaluation.  Strict return precautions given to which he expressed understanding.  Final Clinical Impressions(s) / UC Diagnoses   Final diagnoses:  Acute non-recurrent pansinusitis     Discharge Instructions      Start Augmentin twice daily  for 7 days.  Continue your over-the-counter medications including sinus rinses and Mucinex.  If you have any worsening symptoms including high fever, chest pain, shortness of breath, worsening cough, nausea/vomiting interfering with oral intake, blood sugar persistently above 200 you need to be evaluated immediately.  If symptoms not improving by next week please follow-up here or see your PCP.     ED Prescriptions     Medication Sig Dispense Auth. Provider   amoxicillin-clavulanate (AUGMENTIN) 875-125 MG tablet Take 1 tablet by mouth every 12 (twelve) hours. 14 tablet Elizabeth Paulsen K, PA-C   predniSONE (DELTASONE) 20 MG tablet  (Status: Discontinued) Take 1 tablet (20 mg total) by mouth daily for 3 days. 3 tablet Sareena Odeh, Derry Skill, PA-C      PDMP not reviewed this encounter.   Terrilee Croak, PA-C 09/30/21 1323    Alexismarie Flaim, Derry Skill, PA-C 09/30/21 1328

## 2021-09-30 NOTE — Discharge Instructions (Addendum)
Start Augmentin twice daily for 7 days.  Continue your over-the-counter medications including sinus rinses and Mucinex.  If you have any worsening symptoms including high fever, chest pain, shortness of breath, worsening cough, nausea/vomiting interfering with oral intake, blood sugar persistently above 200 you need to be evaluated immediately.  If symptoms not improving by next week please follow-up here or see your PCP.

## 2021-09-30 NOTE — ED Triage Notes (Signed)
Pt presents with c/o cough, sore throat and nasal congestion X 2 weeks. States he has been sneezing a lot and has been feeling sinus pressure and difficulty breathing x 2 days.   States e has taken OTC medicine, Mucinex and states it has helped. States the sxs have not gone away.

## 2021-10-05 DIAGNOSIS — I10 Essential (primary) hypertension: Secondary | ICD-10-CM | POA: Diagnosis not present

## 2021-10-05 DIAGNOSIS — L409 Psoriasis, unspecified: Secondary | ICD-10-CM | POA: Diagnosis not present

## 2021-10-05 DIAGNOSIS — J019 Acute sinusitis, unspecified: Secondary | ICD-10-CM | POA: Diagnosis not present

## 2021-10-05 DIAGNOSIS — E782 Mixed hyperlipidemia: Secondary | ICD-10-CM | POA: Diagnosis not present

## 2021-10-05 DIAGNOSIS — E1169 Type 2 diabetes mellitus with other specified complication: Secondary | ICD-10-CM | POA: Diagnosis not present

## 2021-10-05 DIAGNOSIS — Z0000 Encounter for general adult medical examination without abnormal findings: Secondary | ICD-10-CM | POA: Diagnosis not present

## 2021-10-05 DIAGNOSIS — M549 Dorsalgia, unspecified: Secondary | ICD-10-CM | POA: Diagnosis not present

## 2021-10-05 DIAGNOSIS — J309 Allergic rhinitis, unspecified: Secondary | ICD-10-CM | POA: Diagnosis not present

## 2021-10-05 DIAGNOSIS — H409 Unspecified glaucoma: Secondary | ICD-10-CM | POA: Diagnosis not present

## 2021-11-01 DIAGNOSIS — E119 Type 2 diabetes mellitus without complications: Secondary | ICD-10-CM | POA: Diagnosis not present

## 2021-12-03 DIAGNOSIS — H401131 Primary open-angle glaucoma, bilateral, mild stage: Secondary | ICD-10-CM | POA: Diagnosis not present

## 2021-12-03 DIAGNOSIS — H04123 Dry eye syndrome of bilateral lacrimal glands: Secondary | ICD-10-CM | POA: Diagnosis not present

## 2022-01-15 ENCOUNTER — Ambulatory Visit (INDEPENDENT_AMBULATORY_CARE_PROVIDER_SITE_OTHER): Payer: Worker's Compensation | Admitting: Pulmonary Disease

## 2022-01-15 ENCOUNTER — Encounter: Payer: Self-pay | Admitting: Pulmonary Disease

## 2022-01-15 ENCOUNTER — Other Ambulatory Visit: Payer: Self-pay | Admitting: Pulmonary Disease

## 2022-01-15 VITALS — BP 130/70 | HR 60 | Temp 97.8°F | Ht 76.0 in | Wt 264.8 lb

## 2022-01-15 DIAGNOSIS — Z77090 Contact with and (suspected) exposure to asbestos: Secondary | ICD-10-CM

## 2022-01-15 DIAGNOSIS — G4733 Obstructive sleep apnea (adult) (pediatric): Secondary | ICD-10-CM

## 2022-01-15 LAB — PULMONARY FUNCTION TEST
DL/VA % pred: 108 %
DL/VA: 4.27 ml/min/mmHg/L
DLCO cor % pred: 98 %
DLCO cor: 30.13 ml/min/mmHg
DLCO unc % pred: 98 %
DLCO unc: 30.13 ml/min/mmHg
FEF 25-75 Post: 3.99 L/sec
FEF 25-75 Pre: 3.78 L/sec
FEF2575-%Change-Post: 5 %
FEF2575-%Pred-Post: 134 %
FEF2575-%Pred-Pre: 127 %
FEV1-%Change-Post: 1 %
FEV1-%Pred-Post: 98 %
FEV1-%Pred-Pre: 97 %
FEV1-Post: 3.94 L
FEV1-Pre: 3.88 L
FEV1FVC-%Change-Post: 4 %
FEV1FVC-%Pred-Pre: 109 %
FEV6-%Change-Post: -2 %
FEV6-%Pred-Post: 91 %
FEV6-%Pred-Pre: 94 %
FEV6-Post: 4.68 L
FEV6-Pre: 4.83 L
FEV6FVC-%Change-Post: 0 %
FEV6FVC-%Pred-Post: 105 %
FEV6FVC-%Pred-Pre: 105 %
FVC-%Change-Post: -3 %
FVC-%Pred-Post: 86 %
FVC-%Pred-Pre: 89 %
FVC-Post: 4.68 L
FVC-Pre: 4.83 L
Post FEV1/FVC ratio: 84 %
Post FEV6/FVC ratio: 100 %
Pre FEV1/FVC ratio: 80 %
Pre FEV6/FVC Ratio: 100 %
RV % pred: 102 %
RV: 2.89 L
TLC % pred: 94 %
TLC: 7.77 L

## 2022-01-15 NOTE — Patient Instructions (Signed)
Follow up in 1 year.

## 2022-01-15 NOTE — Progress Notes (Signed)
? ?[facility] ? ?Chief Complaint  ?Patient presents with  ? Follow-up  ?  No c/o, PFT review   ? ? ?Constitutional:  ?BP 130/70 (BP Location: Right Arm, Patient Position: Sitting, Cuff Size: Normal)   Pulse 60   Temp 97.8 ?F (36.6 ?C) (Oral)   Ht '6\' 4"'$  (1.93 m)   Wt 264 lb 12.8 oz (120.1 kg)   SpO2 97%   BMI 32.23 kg/m?  ? ?Past Medical History:  ?HLD, Allergies, Back pain ? ?Past Surgical History:  ?He  has a past surgical history that includes Nasal sinus surgery (1998); Colonoscopy (2005); and Finger surgery. ? ?Brief Summary:  ?Gary Buckley is a 71 y.o. male former smoker with obstructive sleep apnea and history of asbestos exposure.  ?  ? ? ? ?Subjective:  ? ?CXR from 11/22/20 normal.  PFT today normal. ? ?Not having cough, wheeze, or sputum.  Breathing doesn't limit his activity.  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  Has mild allergies in Spring and Fall. ? ?Using CPAP nightly.  No issues with mask or pressure setting. ? ?Physical Exam:  ? ?Appearance - well kempt  ? ?ENMT - no sinus tenderness, no oral exudate, no LAN, Mallampati 3 airway, no stridor ? ?Respiratory - equal breath sounds bilaterally, no wheezing or rales ? ?CV - s1s2 regular rate and rhythm, no murmurs ? ?Ext - no clubbing, no edema ? ?Skin - no rashes ? ?Psych - normal mood and affect ? ?  ?Pulmonary testing:  ?PFT 01/15/22 >> FEV1 3.94 (98%), FEV1% 84, TLC 7.77 (94%), DLCO 98% ? ?Chest Imaging:  ? ? ?Sleep Tests:  ?PSG 06/21/09 >> AHI 75 ?CPAP 10/21/20 to 11/19/20 >> used on 30 of 30 nights with average 5 hrs 5 min.  Average AHI 2.3 with CPAP 7 cm H2O ? ?Social History:  ?He  reports that he has quit smoking. His smoking use included cigars. He has never used smokeless tobacco. He reports current alcohol use. He reports that he does not use drugs. ? ?Family History:  ?His family history includes Allergies in his brother, father, and sister; Colon cancer in his maternal uncle; Prostate  cancer in his paternal uncle. ?  ? ? ?Assessment/Plan:  ? ?Obstructive sleep apnea. ?- he is compliant with therapy and reports benefit from CPAP ?- uses [employer] for his DME ?- continue CPAP 7 cm H2O ? ?History of asbestos exposure. ?- no signs of asbestos lung disease on imaging studies or PFT findings ?- monitor clinically ? ? ?Time Spent Involved in Patient Care on Day of Examination:  ?28 minutes ? ?Follow up:  ? ?Patient Instructions  ?Follow up in 1 year ? ?Medication List:  ? ?Allergies as of 01/15/2022   ? ?   Reactions  ? Lisinopril Other (See Comments)  ? Dizziness  ? Shrimp [shellfish Allergy] Nausea And Vomiting  ? ?  ? ?  ?Medication List  ?  ? ?  ? Accurate as of January 15, 2022  5:45 PM. If you have any questions, ask your nurse or doctor.  ?  ?  ? ?  ? ?ALPRAZolam 0.25 MG tablet ?Commonly known as: XANAX ?1/2 tablet as needed for anxiety ?  ?amoxicillin-clavulanate 875-125 MG tablet ?Commonly known as: AUGMENTIN ?Take 1 tablet by mouth every 12 (twelve) hours. ?  ?aspirin 81 MG tablet ?Take 81 mg by mouth daily. ?  ?FISH OIL BURP-LESS PO ?Take 2 capsules by mouth 2 (  two) times daily. ?  ?hydrochlorothiazide 25 MG tablet ?Commonly known as: HYDRODIURIL ?Take 25 mg by mouth daily. ?  ?latanoprost 0.005 % ophthalmic solution ?Commonly known as: XALATAN ?Place 1 drop into both eyes at bedtime. ?  ?levocetirizine 5 MG tablet ?Commonly known as: XYZAL ?Take 5 mg by mouth every evening. ?  ?meclizine 25 MG tablet ?Commonly known as: ANTIVERT ?Take 1 tablet (25 mg total) by mouth 3 (three) times daily as needed for dizziness. ?  ?montelukast 10 MG tablet ?Commonly known as: SINGULAIR ?Take 10 mg by mouth at bedtime. ?  ?simvastatin 20 MG tablet ?Commonly known as: ZOCOR ?Take 20 mg by mouth every evening. ?  ?timolol 0.5 % ophthalmic solution ?Commonly known as: BETIMOL ?Place 1 drop into both eyes 2 (two) times daily. ?  ?vitamin C 1000 MG tablet ?Take 1,000 mg by mouth daily. ?  ? ?  ? ? ?Signature:  ?Chesley Mires, MD ?[place] ?Pager - [phone] ?01/15/2022, 5:45 PM ?  ? ? ? ? ? ? ? ? ?

## 2022-01-15 NOTE — Progress Notes (Signed)
PFT done today. 

## 2022-03-14 DIAGNOSIS — E119 Type 2 diabetes mellitus without complications: Secondary | ICD-10-CM | POA: Diagnosis not present

## 2022-04-04 DIAGNOSIS — J019 Acute sinusitis, unspecified: Secondary | ICD-10-CM | POA: Diagnosis not present

## 2022-04-04 DIAGNOSIS — E782 Mixed hyperlipidemia: Secondary | ICD-10-CM | POA: Diagnosis not present

## 2022-04-04 DIAGNOSIS — E1169 Type 2 diabetes mellitus with other specified complication: Secondary | ICD-10-CM | POA: Diagnosis not present

## 2022-04-04 DIAGNOSIS — I10 Essential (primary) hypertension: Secondary | ICD-10-CM | POA: Diagnosis not present

## 2022-05-02 DIAGNOSIS — G4733 Obstructive sleep apnea (adult) (pediatric): Secondary | ICD-10-CM | POA: Diagnosis not present

## 2022-05-10 DIAGNOSIS — M5416 Radiculopathy, lumbar region: Secondary | ICD-10-CM | POA: Diagnosis not present

## 2022-05-11 DIAGNOSIS — U071 COVID-19: Secondary | ICD-10-CM | POA: Diagnosis not present

## 2022-06-05 DIAGNOSIS — H26491 Other secondary cataract, right eye: Secondary | ICD-10-CM | POA: Diagnosis not present

## 2022-06-05 DIAGNOSIS — E119 Type 2 diabetes mellitus without complications: Secondary | ICD-10-CM | POA: Diagnosis not present

## 2022-06-05 DIAGNOSIS — H43813 Vitreous degeneration, bilateral: Secondary | ICD-10-CM | POA: Diagnosis not present

## 2022-06-05 DIAGNOSIS — H02403 Unspecified ptosis of bilateral eyelids: Secondary | ICD-10-CM | POA: Diagnosis not present

## 2022-06-05 DIAGNOSIS — H401131 Primary open-angle glaucoma, bilateral, mild stage: Secondary | ICD-10-CM | POA: Diagnosis not present

## 2022-06-24 DIAGNOSIS — Z87891 Personal history of nicotine dependence: Secondary | ICD-10-CM | POA: Diagnosis not present

## 2022-06-24 DIAGNOSIS — R0981 Nasal congestion: Secondary | ICD-10-CM | POA: Diagnosis not present

## 2022-06-24 DIAGNOSIS — M950 Acquired deformity of nose: Secondary | ICD-10-CM | POA: Diagnosis not present

## 2022-06-24 DIAGNOSIS — J3089 Other allergic rhinitis: Secondary | ICD-10-CM | POA: Diagnosis not present

## 2022-07-04 DIAGNOSIS — E119 Type 2 diabetes mellitus without complications: Secondary | ICD-10-CM | POA: Diagnosis not present

## 2022-08-01 DIAGNOSIS — G4733 Obstructive sleep apnea (adult) (pediatric): Secondary | ICD-10-CM | POA: Diagnosis not present

## 2022-10-17 DIAGNOSIS — L409 Psoriasis, unspecified: Secondary | ICD-10-CM | POA: Diagnosis not present

## 2022-10-17 DIAGNOSIS — E669 Obesity, unspecified: Secondary | ICD-10-CM | POA: Diagnosis not present

## 2022-10-17 DIAGNOSIS — E119 Type 2 diabetes mellitus without complications: Secondary | ICD-10-CM | POA: Diagnosis not present

## 2022-10-17 DIAGNOSIS — I10 Essential (primary) hypertension: Secondary | ICD-10-CM | POA: Diagnosis not present

## 2022-10-17 DIAGNOSIS — Z125 Encounter for screening for malignant neoplasm of prostate: Secondary | ICD-10-CM | POA: Diagnosis not present

## 2022-10-17 DIAGNOSIS — Z23 Encounter for immunization: Secondary | ICD-10-CM | POA: Diagnosis not present

## 2022-10-17 DIAGNOSIS — N529 Male erectile dysfunction, unspecified: Secondary | ICD-10-CM | POA: Diagnosis not present

## 2022-10-17 DIAGNOSIS — J309 Allergic rhinitis, unspecified: Secondary | ICD-10-CM | POA: Diagnosis not present

## 2022-10-17 DIAGNOSIS — Z0000 Encounter for general adult medical examination without abnormal findings: Secondary | ICD-10-CM | POA: Diagnosis not present

## 2022-10-17 DIAGNOSIS — H409 Unspecified glaucoma: Secondary | ICD-10-CM | POA: Diagnosis not present

## 2022-10-17 DIAGNOSIS — M549 Dorsalgia, unspecified: Secondary | ICD-10-CM | POA: Diagnosis not present

## 2022-10-17 DIAGNOSIS — E782 Mixed hyperlipidemia: Secondary | ICD-10-CM | POA: Diagnosis not present

## 2022-10-17 DIAGNOSIS — E1169 Type 2 diabetes mellitus with other specified complication: Secondary | ICD-10-CM | POA: Diagnosis not present

## 2022-10-31 DIAGNOSIS — G4733 Obstructive sleep apnea (adult) (pediatric): Secondary | ICD-10-CM | POA: Diagnosis not present

## 2022-11-13 DIAGNOSIS — D229 Melanocytic nevi, unspecified: Secondary | ICD-10-CM | POA: Diagnosis not present

## 2022-11-13 DIAGNOSIS — L814 Other melanin hyperpigmentation: Secondary | ICD-10-CM | POA: Diagnosis not present

## 2022-11-13 DIAGNOSIS — L590 Erythema ab igne [dermatitis ab igne]: Secondary | ICD-10-CM | POA: Diagnosis not present

## 2022-11-13 DIAGNOSIS — L409 Psoriasis, unspecified: Secondary | ICD-10-CM | POA: Diagnosis not present

## 2022-11-13 DIAGNOSIS — D1801 Hemangioma of skin and subcutaneous tissue: Secondary | ICD-10-CM | POA: Diagnosis not present

## 2022-11-13 DIAGNOSIS — L570 Actinic keratosis: Secondary | ICD-10-CM | POA: Diagnosis not present

## 2022-11-13 DIAGNOSIS — L80 Vitiligo: Secondary | ICD-10-CM | POA: Diagnosis not present

## 2022-11-13 DIAGNOSIS — L578 Other skin changes due to chronic exposure to nonionizing radiation: Secondary | ICD-10-CM | POA: Diagnosis not present

## 2022-11-13 DIAGNOSIS — L821 Other seborrheic keratosis: Secondary | ICD-10-CM | POA: Diagnosis not present

## 2022-11-29 DIAGNOSIS — G4733 Obstructive sleep apnea (adult) (pediatric): Secondary | ICD-10-CM | POA: Diagnosis not present

## 2022-12-11 DIAGNOSIS — H401131 Primary open-angle glaucoma, bilateral, mild stage: Secondary | ICD-10-CM | POA: Diagnosis not present

## 2022-12-30 DIAGNOSIS — G4733 Obstructive sleep apnea (adult) (pediatric): Secondary | ICD-10-CM | POA: Diagnosis not present

## 2023-01-29 DIAGNOSIS — Z008 Encounter for other general examination: Secondary | ICD-10-CM | POA: Diagnosis not present

## 2023-01-29 DIAGNOSIS — Z8249 Family history of ischemic heart disease and other diseases of the circulatory system: Secondary | ICD-10-CM | POA: Diagnosis not present

## 2023-01-29 DIAGNOSIS — E669 Obesity, unspecified: Secondary | ICD-10-CM | POA: Diagnosis not present

## 2023-01-29 DIAGNOSIS — Z833 Family history of diabetes mellitus: Secondary | ICD-10-CM | POA: Diagnosis not present

## 2023-01-29 DIAGNOSIS — I10 Essential (primary) hypertension: Secondary | ICD-10-CM | POA: Diagnosis not present

## 2023-01-29 DIAGNOSIS — H409 Unspecified glaucoma: Secondary | ICD-10-CM | POA: Diagnosis not present

## 2023-01-29 DIAGNOSIS — Z7982 Long term (current) use of aspirin: Secondary | ICD-10-CM | POA: Diagnosis not present

## 2023-01-29 DIAGNOSIS — E785 Hyperlipidemia, unspecified: Secondary | ICD-10-CM | POA: Diagnosis not present

## 2023-01-29 DIAGNOSIS — Z6831 Body mass index (BMI) 31.0-31.9, adult: Secondary | ICD-10-CM | POA: Diagnosis not present

## 2023-01-29 DIAGNOSIS — E119 Type 2 diabetes mellitus without complications: Secondary | ICD-10-CM | POA: Diagnosis not present

## 2023-01-29 DIAGNOSIS — G4733 Obstructive sleep apnea (adult) (pediatric): Secondary | ICD-10-CM | POA: Diagnosis not present

## 2023-01-29 DIAGNOSIS — J309 Allergic rhinitis, unspecified: Secondary | ICD-10-CM | POA: Diagnosis not present

## 2023-01-29 DIAGNOSIS — L98421 Non-pressure chronic ulcer of back limited to breakdown of skin: Secondary | ICD-10-CM | POA: Diagnosis not present

## 2023-02-11 ENCOUNTER — Encounter (HOSPITAL_BASED_OUTPATIENT_CLINIC_OR_DEPARTMENT_OTHER): Payer: Self-pay | Admitting: Pulmonary Disease

## 2023-02-11 ENCOUNTER — Ambulatory Visit (HOSPITAL_BASED_OUTPATIENT_CLINIC_OR_DEPARTMENT_OTHER): Payer: PRIVATE HEALTH INSURANCE

## 2023-02-11 ENCOUNTER — Ambulatory Visit (INDEPENDENT_AMBULATORY_CARE_PROVIDER_SITE_OTHER): Payer: PRIVATE HEALTH INSURANCE | Admitting: Pulmonary Disease

## 2023-02-11 VITALS — BP 138/88 | HR 58 | Temp 98.2°F | Ht 76.0 in | Wt 269.4 lb

## 2023-02-11 DIAGNOSIS — Z77090 Contact with and (suspected) exposure to asbestos: Secondary | ICD-10-CM | POA: Diagnosis not present

## 2023-02-11 DIAGNOSIS — G4733 Obstructive sleep apnea (adult) (pediatric): Secondary | ICD-10-CM | POA: Diagnosis not present

## 2023-02-11 NOTE — Patient Instructions (Signed)
Chest xray today  Follow up in 1 year 

## 2023-02-11 NOTE — Progress Notes (Signed)
[facility]  Chief Complaint  Patient presents with   Follow-up    Follow up. Patient has no complaints.     Constitutional:  BP 138/88 (BP Location: Left Arm, Patient Position: Sitting, Cuff Size: Normal)   Pulse (!) 58   Temp 98.2 F (36.8 C) (Oral)   Ht 6\' 4"  (1.93 m)   Wt 269 lb 6.4 oz (122.2 kg)   SpO2 98%   BMI 32.79 kg/m   Past Medical History:  HLD, Allergies, Back pain  Past Surgical History:  He  has a past surgical history that includes Nasal sinus surgery (1998); Colonoscopy (2005); and Finger surgery.  Brief Summary:  Gary Buckley is a 72 y.o. male former smoker with obstructive sleep apnea and history of asbestos exposure.       Subjective:   Uses CPAP without difficulty.  Has nasal mask.  No issues with mask fit or mouth dryness.  Sometimes falls asleep before putting mask on.  Not having cough, wheeze, chest pain, or chest congestion.    Physical Exam:   Appearance - well kempt   ENMT - no sinus tenderness, no oral exudate, no LAN, Mallampati 3 airway, no stridor  Respiratory - equal breath sounds bilaterally, no wheezing or rales  CV - s1s2 regular rate and rhythm, no murmurs  Ext - no clubbing, no edema  Skin - no rashes  Psych - normal mood and affect     Pulmonary testing:  PFT 01/15/22 >> FEV1 3.94 (98%), FEV1% 84, TLC 7.77 (94%), DLCO 98%  Chest Imaging:    Sleep Tests:  PSG 06/21/09 >> AHI 75 CPAP 01/12/23 to 02/10/23 >> used on 27 of 30 nights with average 4 hrs 21 min.  Average AHI 3.5 with CPAP 7 cm H2O  Social History:  He  reports that he has quit smoking. His smoking use included cigars. He has never used smokeless tobacco. He reports current alcohol use. He reports that he does not use drugs.  Family History:  His family history includes Allergies in his brother, father, and sister; Colon cancer in his maternal uncle; Prostate cancer in his paternal uncle.      Assessment/Plan:   Obstructive sleep apnea. - he is compliant with therapy and reports benefit from CPAP - uses [employer] for his DME - current CPAP ordered September 2020 - continue CPAP 7 cm H2O  History of asbestos exposure. - repeat chest xray today to monitor for signs of asbestos related lung disease  Time Spent Involved in Patient Care on Day of Examination:  25 minutes  Follow up:   Patient Instructions  Chest xray today  Follow up in 1 year  Medication List:   Allergies as of 02/11/2023       Reactions   Lisinopril Other (See Comments)   Dizziness   Shrimp [shellfish Allergy] Nausea And Vomiting        Medication List        Accurate as of Laurie 14, 2024  1:19 PM. If you have any questions, ask your nurse or doctor.          ALPRAZolam 0.25 MG tablet Commonly known as: XANAX 1/2 tablet as needed for anxiety   aspirin 81 MG tablet Take 81 mg by mouth daily.   FISH OIL BURP-LESS PO Take 2 capsules by mouth 2 (two) times daily.   hydrochlorothiazide 25 MG tablet Commonly known as: HYDRODIURIL Take 25 mg by mouth daily.  latanoprost 0.005 % ophthalmic solution Commonly known as: XALATAN Place 1 drop into both eyes at bedtime.   levocetirizine 5 MG tablet Commonly known as: XYZAL Take 5 mg by mouth every evening.   meclizine 25 MG tablet Commonly known as: ANTIVERT Take 1 tablet (25 mg total) by mouth 3 (three) times daily as needed for dizziness.   montelukast 10 MG tablet Commonly known as: SINGULAIR Take 10 mg by mouth at bedtime.   simvastatin 20 MG tablet Commonly known as: ZOCOR Take 20 mg by mouth every evening.   timolol 0.5 % ophthalmic solution Commonly known as: BETIMOL Place 1 drop into both eyes 2 (two) times daily.   vitamin C 1000 MG tablet Take 1,000 mg by mouth daily.        Signature:  Coralyn Helling, MD [facility] Pager - [phone] 02/11/2023, 1:19 PM

## 2023-05-02 DIAGNOSIS — H401131 Primary open-angle glaucoma, bilateral, mild stage: Secondary | ICD-10-CM | POA: Diagnosis not present

## 2023-05-13 DIAGNOSIS — G4733 Obstructive sleep apnea (adult) (pediatric): Secondary | ICD-10-CM | POA: Diagnosis not present

## 2023-06-03 DIAGNOSIS — A070 Balantidiasis: Secondary | ICD-10-CM | POA: Diagnosis not present

## 2023-06-03 DIAGNOSIS — M549 Dorsalgia, unspecified: Secondary | ICD-10-CM | POA: Diagnosis not present

## 2023-06-03 DIAGNOSIS — E782 Mixed hyperlipidemia: Secondary | ICD-10-CM | POA: Diagnosis not present

## 2023-06-03 DIAGNOSIS — R252 Cramp and spasm: Secondary | ICD-10-CM | POA: Diagnosis not present

## 2023-06-03 DIAGNOSIS — N529 Male erectile dysfunction, unspecified: Secondary | ICD-10-CM | POA: Diagnosis not present

## 2023-06-03 DIAGNOSIS — E669 Obesity, unspecified: Secondary | ICD-10-CM | POA: Diagnosis not present

## 2023-06-03 DIAGNOSIS — E119 Type 2 diabetes mellitus without complications: Secondary | ICD-10-CM | POA: Diagnosis not present

## 2023-06-03 DIAGNOSIS — I10 Essential (primary) hypertension: Secondary | ICD-10-CM | POA: Diagnosis not present

## 2023-06-03 DIAGNOSIS — E1169 Type 2 diabetes mellitus with other specified complication: Secondary | ICD-10-CM | POA: Diagnosis not present

## 2023-06-05 DIAGNOSIS — E119 Type 2 diabetes mellitus without complications: Secondary | ICD-10-CM | POA: Diagnosis not present

## 2023-06-11 DIAGNOSIS — M533 Sacrococcygeal disorders, not elsewhere classified: Secondary | ICD-10-CM | POA: Diagnosis not present

## 2023-06-13 DIAGNOSIS — G4733 Obstructive sleep apnea (adult) (pediatric): Secondary | ICD-10-CM | POA: Diagnosis not present

## 2023-06-18 DIAGNOSIS — L409 Psoriasis, unspecified: Secondary | ICD-10-CM | POA: Diagnosis not present

## 2023-06-18 DIAGNOSIS — L570 Actinic keratosis: Secondary | ICD-10-CM | POA: Diagnosis not present

## 2023-07-13 DIAGNOSIS — G4733 Obstructive sleep apnea (adult) (pediatric): Secondary | ICD-10-CM | POA: Diagnosis not present

## 2023-07-29 DIAGNOSIS — Z9989 Dependence on other enabling machines and devices: Secondary | ICD-10-CM | POA: Diagnosis not present

## 2023-07-29 DIAGNOSIS — M25561 Pain in right knee: Secondary | ICD-10-CM | POA: Diagnosis not present

## 2023-08-13 DIAGNOSIS — H02403 Unspecified ptosis of bilateral eyelids: Secondary | ICD-10-CM | POA: Diagnosis not present

## 2023-08-13 DIAGNOSIS — H524 Presbyopia: Secondary | ICD-10-CM | POA: Diagnosis not present

## 2023-08-13 DIAGNOSIS — H43813 Vitreous degeneration, bilateral: Secondary | ICD-10-CM | POA: Diagnosis not present

## 2023-08-13 DIAGNOSIS — E119 Type 2 diabetes mellitus without complications: Secondary | ICD-10-CM | POA: Diagnosis not present

## 2023-08-13 DIAGNOSIS — H401131 Primary open-angle glaucoma, bilateral, mild stage: Secondary | ICD-10-CM | POA: Diagnosis not present

## 2023-08-13 DIAGNOSIS — H26491 Other secondary cataract, right eye: Secondary | ICD-10-CM | POA: Diagnosis not present

## 2023-09-10 DIAGNOSIS — M25561 Pain in right knee: Secondary | ICD-10-CM | POA: Diagnosis not present

## 2023-10-29 DIAGNOSIS — G4733 Obstructive sleep apnea (adult) (pediatric): Secondary | ICD-10-CM | POA: Diagnosis not present

## 2023-10-30 DIAGNOSIS — E119 Type 2 diabetes mellitus without complications: Secondary | ICD-10-CM | POA: Diagnosis not present

## 2023-11-13 DIAGNOSIS — L578 Other skin changes due to chronic exposure to nonionizing radiation: Secondary | ICD-10-CM | POA: Diagnosis not present

## 2023-11-13 DIAGNOSIS — D1801 Hemangioma of skin and subcutaneous tissue: Secondary | ICD-10-CM | POA: Diagnosis not present

## 2023-11-13 DIAGNOSIS — L814 Other melanin hyperpigmentation: Secondary | ICD-10-CM | POA: Diagnosis not present

## 2023-11-13 DIAGNOSIS — D229 Melanocytic nevi, unspecified: Secondary | ICD-10-CM | POA: Diagnosis not present

## 2023-11-13 DIAGNOSIS — L570 Actinic keratosis: Secondary | ICD-10-CM | POA: Diagnosis not present

## 2023-11-13 DIAGNOSIS — L821 Other seborrheic keratosis: Secondary | ICD-10-CM | POA: Diagnosis not present

## 2023-11-22 DIAGNOSIS — R051 Acute cough: Secondary | ICD-10-CM | POA: Diagnosis not present

## 2023-11-22 DIAGNOSIS — J019 Acute sinusitis, unspecified: Secondary | ICD-10-CM | POA: Diagnosis not present

## 2023-11-22 DIAGNOSIS — R0981 Nasal congestion: Secondary | ICD-10-CM | POA: Diagnosis not present

## 2023-12-11 DIAGNOSIS — E119 Type 2 diabetes mellitus without complications: Secondary | ICD-10-CM | POA: Diagnosis not present

## 2024-01-14 DIAGNOSIS — E119 Type 2 diabetes mellitus without complications: Secondary | ICD-10-CM | POA: Diagnosis not present

## 2024-02-10 DIAGNOSIS — G4733 Obstructive sleep apnea (adult) (pediatric): Secondary | ICD-10-CM | POA: Diagnosis not present

## 2024-02-10 DIAGNOSIS — H401132 Primary open-angle glaucoma, bilateral, moderate stage: Secondary | ICD-10-CM | POA: Diagnosis not present

## 2024-02-10 DIAGNOSIS — H26491 Other secondary cataract, right eye: Secondary | ICD-10-CM | POA: Diagnosis not present

## 2024-02-17 DIAGNOSIS — H26491 Other secondary cataract, right eye: Secondary | ICD-10-CM | POA: Diagnosis not present

## 2024-04-28 DIAGNOSIS — E119 Type 2 diabetes mellitus without complications: Secondary | ICD-10-CM | POA: Diagnosis not present

## 2024-04-29 DIAGNOSIS — J069 Acute upper respiratory infection, unspecified: Secondary | ICD-10-CM | POA: Diagnosis not present

## 2024-04-29 DIAGNOSIS — J019 Acute sinusitis, unspecified: Secondary | ICD-10-CM | POA: Diagnosis not present

## 2024-04-29 DIAGNOSIS — Z20822 Contact with and (suspected) exposure to covid-19: Secondary | ICD-10-CM | POA: Diagnosis not present

## 2024-05-25 DIAGNOSIS — G4733 Obstructive sleep apnea (adult) (pediatric): Secondary | ICD-10-CM | POA: Diagnosis not present

## 2024-06-15 ENCOUNTER — Encounter: Payer: Self-pay | Admitting: Internal Medicine

## 2024-07-12 ENCOUNTER — Encounter: Payer: Self-pay | Admitting: Internal Medicine

## 2024-07-26 DIAGNOSIS — E119 Type 2 diabetes mellitus without complications: Secondary | ICD-10-CM | POA: Diagnosis not present

## 2024-07-29 NOTE — Progress Notes (Addendum)
 Gary Buckley                                          MRN: [id number]   10/12/2024   The [facility] Quality Team Specialist reviewed this patient medical record for the purposes of chart review for care gap closure. The following were reviewed: chart review for care gap closure-controlling blood pressure.    [facility] Quality Team

## 2024-08-20 ENCOUNTER — Encounter: Payer: Self-pay | Admitting: Internal Medicine

## 2024-08-23 DIAGNOSIS — G4733 Obstructive sleep apnea (adult) (pediatric): Secondary | ICD-10-CM | POA: Diagnosis not present

## 2024-09-09 DIAGNOSIS — Z77090 Contact with and (suspected) exposure to asbestos: Secondary | ICD-10-CM | POA: Diagnosis not present

## 2024-10-07 ENCOUNTER — Ambulatory Visit: Payer: PRIVATE HEALTH INSURANCE | Admitting: Internal Medicine

## 2024-10-07 ENCOUNTER — Encounter: Payer: Self-pay | Admitting: Internal Medicine

## 2024-10-07 VITALS — BP 138/88 | HR 63 | Ht 76.0 in | Wt 261.2 lb

## 2024-10-07 DIAGNOSIS — R1013 Epigastric pain: Secondary | ICD-10-CM | POA: Diagnosis not present

## 2024-10-07 DIAGNOSIS — T887XXA Unspecified adverse effect of drug or medicament, initial encounter: Secondary | ICD-10-CM

## 2024-10-07 DIAGNOSIS — M7918 Myalgia, other site: Secondary | ICD-10-CM | POA: Diagnosis not present

## 2024-10-07 DIAGNOSIS — Z1211 Encounter for screening for malignant neoplasm of colon: Secondary | ICD-10-CM | POA: Diagnosis not present

## 2024-10-07 MED ORDER — NA SULFATE-K SULFATE-MG SULF 17.5-3.13-1.6 GM/177ML PO SOLN: 1.0000 | ORAL | 0 refills | Status: AC

## 2024-10-07 NOTE — Progress Notes (Signed)
 Gary Buckley 74 y.o. 1951/02/01 [id number]  Assessment & Plan:   Encounter Diagnoses  Name Primary?   Colon cancer screening Yes   Musculoskeletal pain    Dyspepsia    Medication side effect    He is due for routine colorectal cancer screening. Colonoscopy was chosen for its diagnostic and therapeutic benefits. Risks of the procedure were discussed. - Discussed screening options: colonoscopy and Cologuard. - Engaged in shared decision-making; he chose colonoscopy. - Reviewed colonoscopy procedure: bowel preparation, IV sedation, polypectomy. - Discussed risks: injury, bleeding, anesthesia-related complications, phlebitis, dental injury. - Ordered colonoscopy for screening.    Intermittent nocturnal heartburn and belching improved after stopping Rybelsus. Symptoms likely exacerbated by diet and Alka-Seltzer use. Discussed risks of acid exposure and aspirin -related irritation. - Recommended dietary modifications: reduce late-night intake, identify/reduce triggers (spicy foods, tomato products, citrus). - Provided handout on dietary triggers. - Advised switching from Alka-Seltzer to famotidine with evening meal. - Discussed Rybelsus potential for gastrointestinal symptoms. - Advised discussing symptoms and medication history with primary care provider.  Musculoskeletal pain issues lower thorax, flanks, some refgerred abdominal pain, I think - see PCP ? PT referral   I appreciate the opportunity to care for this patient.    Rr:Dyjt, Joen, MD      Subjective:   Chief Complaint: colon cancer screening  HPI Discussed the use of AI scribe software for clinical note transcription with the patient, who gave verbal consent to proceed.  Gary Buckley is a 74 year old male with type 2 diabetes who presents for evaluation of repeat colorectal cancer screening and gastrointestinal symptoms.  Colorectal cancer screening - Last colonoscopy performed in September 2015  with no polyps identified. - Considering repeat colorectal cancer screening at this time, in accordance with recommended intervals for average-risk individuals.  Abdominal pain - Intermittent pain localized to the lateral abdomen, onset approximately one year ago. - Pain triggered by twisting or certain movements, such as turning in the shower or performing outdoor activities. - Pain can be severe enough to nearly cause him to fall. - No current abdominal pain. - No associated changes in bowel habits.  Gastroesophageal reflux symptoms - Heartburn and indigestion, particularly nocturnal symptoms after eating. - Associated belching. - Symptoms have improved over the past two to three weeks. - Previously used Alka-Seltzer, typically two per day; currently reduced to one tablet at a time. - Wears pants tightly, which Rance contribute to symptoms. - Tends to graze on food until 9 or 10 PM most nights. - Rare soda consumption and only drinks coffee in the morning.  Glycemic control and medication effects - Completed a 90-day course of Rybelsus for type 2 diabetes, last dose taken two or three days ago. - Improvement in gastrointestinal symptoms since discontinuing Rybelsus. - Small amount of weight gain over the past six months.  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   Review of Systems  As per HPI Objective:   Physical Exam @BP  138/88   Pulse 63   Ht 6' 4 (1.93 m)   Wt 261 lb 4 oz (118.5 kg)   SpO2 97%   BMI 31.80 kg/m @  General:  NAD Eyes:   anicteric Lungs:  clear Heart::  S1S2 no rubs, murmurs or gallops Abdomen:  soft and mildly tender bilat LQ no rebound, BS+  Chest wall is tender bilat flanks and especially with twisting side to side - provokes    Ext:   no edema, cyanosis or clubbing    Data Reviewed:  See HPI     [1]  Allergies Allergen Reactions   Lisinopril Other (See Comments)    Dizziness   Shrimp [Shellfish Allergy] Nausea And Vomiting  [2]  Current Meds  Medication Sig   Ascorbic Acid (VITAMIN C) 1000 MG tablet Take 1,000 mg by mouth daily.    aspirin  81 MG tablet Take 81 mg by mouth daily.   hydrochlorothiazide (HYDRODIURIL) 25 MG tablet Take 25 mg by mouth daily.   latanoprost (XALATAN) 0.005 % ophthalmic solution Place 1 drop into both eyes at bedtime.   levocetirizine (XYZAL) 5 MG tablet Take 5 mg by mouth every evening.   Magnesium Gluconate 550 MG TABS Take 30 mg by mouth.   meclizine  (ANTIVERT ) 25 MG tablet Take 1 tablet (25 mg total) by mouth 3 (three) times daily as needed for dizziness.   montelukast (SINGULAIR) 10 MG tablet Take 10 mg by mouth at bedtime.   Na Sulfate-K Sulfate-Mg Sulfate concentrate (SUPREP) 17.5-3.13-1.6 GM/177ML SOLN Take 1 kit (354 mLs total) by mouth as directed.   Omega-3 Fatty Acids (FISH OIL BURP-LESS PO) Take 2 capsules by mouth 2 (two) times daily.    simvastatin (ZOCOR) 20 MG tablet Take 20 mg by mouth every evening.    timolol (BETIMOL) 0.5 % ophthalmic solution Place 1 drop into both eyes 2 (two) times daily.   "

## 2024-10-07 NOTE — Patient Instructions (Addendum)
" °  VISIT SUMMARY: Today, we discussed your colorectal cancer screening options, evaluated your abdominal pain and gastroesophageal reflux symptoms, and reviewed your glycemic control and physical activity. You chose to proceed with a colonoscopy for colorectal cancer screening.  YOUR PLAN: COLORECTAL CANCER SCREENING: You are due for routine colorectal cancer screening. We discussed the options and you chose to proceed with a colonoscopy. -A colonoscopy has been ordered for your screening. -We reviewed the procedure, including bowel preparation, IV sedation, and polypectomy. -We discussed the risks, including injury, bleeding, anesthesia-related complications, phlebitis, and dental injury. -Guidance on insurance coverage for preventive screening was provided. -A note will be sent to your primary care provider.  DYSPEPSIA You have intermittent nocturnal heartburn and belching, which have improved after stopping Rybelsus. I suspect that Cala be the cause - please discuss with Dr. Loreli  -Reduce late-night food intake and identify/reduce triggers such as spicy foods, tomato products, and citrus. -You Buckwalter need to take over the counter famotidine with your evening meal if this persists -A handout on dietary triggers was provided.   I appreciate the opportunity to care for you. Lupita Commander, MD, [facility]                                             Contains text generated by [employer].   "

## 2024-10-08 ENCOUNTER — Encounter: Payer: Self-pay | Admitting: Internal Medicine

## 2024-10-22 ENCOUNTER — Other Ambulatory Visit (HOSPITAL_COMMUNITY): Payer: Self-pay | Admitting: Family Medicine

## 2024-10-22 DIAGNOSIS — R109 Unspecified abdominal pain: Secondary | ICD-10-CM

## 2024-11-17 ENCOUNTER — Encounter: Payer: PRIVATE HEALTH INSURANCE | Admitting: Internal Medicine

## 2024-11-26 ENCOUNTER — Encounter: Payer: PRIVATE HEALTH INSURANCE | Admitting: Internal Medicine

## 2024-11-29 ENCOUNTER — Other Ambulatory Visit (HOSPITAL_COMMUNITY)
